# Patient Record
Sex: Male | Born: 1960 | Race: White | Hispanic: No | Smoking: Current every day smoker
Health system: Southern US, Community
[De-identification: ages and names within clinical notes are randomized; demographics above are authoritative.]

## PROBLEM LIST (undated history)

## (undated) DIAGNOSIS — I779 Disorder of arteries and arterioles, unspecified: Secondary | ICD-10-CM

## (undated) DIAGNOSIS — Z86718 Personal history of other venous thrombosis and embolism: Secondary | ICD-10-CM

## (undated) DIAGNOSIS — I739 Peripheral vascular disease, unspecified: Secondary | ICD-10-CM

## (undated) DIAGNOSIS — I639 Cerebral infarction, unspecified: Secondary | ICD-10-CM

## (undated) DIAGNOSIS — F17200 Nicotine dependence, unspecified, uncomplicated: Secondary | ICD-10-CM

## (undated) DIAGNOSIS — R3129 Other microscopic hematuria: Secondary | ICD-10-CM

## (undated) DIAGNOSIS — I10 Essential (primary) hypertension: Secondary | ICD-10-CM

## (undated) DIAGNOSIS — J449 Chronic obstructive pulmonary disease, unspecified: Secondary | ICD-10-CM

## (undated) DIAGNOSIS — I712 Thoracic aortic aneurysm, without rupture, unspecified: Secondary | ICD-10-CM

## (undated) DIAGNOSIS — R911 Solitary pulmonary nodule: Secondary | ICD-10-CM

## (undated) DIAGNOSIS — I4892 Unspecified atrial flutter: Secondary | ICD-10-CM

## (undated) DIAGNOSIS — E785 Hyperlipidemia, unspecified: Secondary | ICD-10-CM

## (undated) DIAGNOSIS — I4891 Unspecified atrial fibrillation: Secondary | ICD-10-CM

## (undated) HISTORY — DX: Cerebral infarction, unspecified: I63.9

## (undated) HISTORY — DX: Solitary pulmonary nodule: R91.1

## (undated) HISTORY — DX: Peripheral vascular disease, unspecified: I73.9

## (undated) HISTORY — DX: Thoracic aortic aneurysm, without rupture: I71.2

## (undated) HISTORY — DX: Hyperlipidemia, unspecified: E78.5

## (undated) HISTORY — DX: Unspecified atrial flutter: I48.92

## (undated) HISTORY — DX: Nicotine dependence, unspecified, uncomplicated: F17.200

## (undated) HISTORY — DX: Disorder of arteries and arterioles, unspecified: I77.9

## (undated) HISTORY — DX: Other microscopic hematuria: R31.29

## (undated) HISTORY — DX: Thoracic aortic aneurysm, without rupture, unspecified: I71.20

## (undated) HISTORY — DX: Chronic obstructive pulmonary disease, unspecified: J44.9

## (undated) HISTORY — DX: Personal history of other venous thrombosis and embolism: Z86.718

## (undated) HISTORY — PX: ATRIAL FIBRILLATION ABLATION: EP1191

---

## 2008-09-18 HISTORY — PX: MAZE: SHX5063

## 2017-01-01 DIAGNOSIS — I480 Paroxysmal atrial fibrillation: Secondary | ICD-10-CM | POA: Insufficient documentation

## 2017-06-18 ENCOUNTER — Emergency Department (HOSPITAL_COMMUNITY): Payer: Medicare Other

## 2017-06-18 ENCOUNTER — Encounter (HOSPITAL_COMMUNITY): Payer: Self-pay

## 2017-06-18 DIAGNOSIS — Z5321 Procedure and treatment not carried out due to patient leaving prior to being seen by health care provider: Secondary | ICD-10-CM | POA: Insufficient documentation

## 2017-06-18 DIAGNOSIS — M79602 Pain in left arm: Secondary | ICD-10-CM | POA: Diagnosis not present

## 2017-06-18 LAB — CBC
HCT: 50 % (ref 39.0–52.0)
Hemoglobin: 17.7 g/dL — ABNORMAL HIGH (ref 13.0–17.0)
MCH: 31.9 pg (ref 26.0–34.0)
MCHC: 35.4 g/dL (ref 30.0–36.0)
MCV: 90.3 fL (ref 78.0–100.0)
PLATELETS: 188 10*3/uL (ref 150–400)
RBC: 5.54 MIL/uL (ref 4.22–5.81)
RDW: 14.8 % (ref 11.5–15.5)
WBC: 9.8 10*3/uL (ref 4.0–10.5)

## 2017-06-18 LAB — BASIC METABOLIC PANEL
Anion gap: 8 (ref 5–15)
BUN: 18 mg/dL (ref 6–20)
CO2: 24 mmol/L (ref 22–32)
CREATININE: 1.35 mg/dL — AB (ref 0.61–1.24)
Calcium: 8.9 mg/dL (ref 8.9–10.3)
Chloride: 103 mmol/L (ref 101–111)
GFR calc Af Amer: >60 mL/min (ref >60)
GFR, EST NON AFRICAN AMERICAN: 57 mL/min — AB (ref >60)
GLUCOSE: 111 mg/dL — AB (ref 65–99)
Potassium: 3.7 mmol/L (ref 3.5–5.1)
Sodium: 135 mmol/L (ref 135–145)

## 2017-06-18 LAB — PROTIME-INR
INR: 2.87
Prothrombin Time: 29.9 seconds — ABNORMAL HIGH (ref 11.4–15.2)

## 2017-06-18 NOTE — ED Triage Notes (Signed)
Pt c/o of left arm pain at 5/10; pt denies injury; pt has bruising to left arm which has increased in the last few hours; pt is on coumadin for chronic afib; Pt a&ox 4 on arrival. No other complaints at triage

## 2017-06-19 ENCOUNTER — Emergency Department (HOSPITAL_COMMUNITY)
Admission: EM | Admit: 2017-06-19 | Discharge: 2017-06-19 | Discharge status: Against Medical Advice | Payer: Medicare Other | Attending: Emergency Medicine | Admitting: Emergency Medicine

## 2017-06-19 HISTORY — DX: Unspecified atrial fibrillation: I48.91

## 2017-06-19 HISTORY — DX: Essential (primary) hypertension: I10

## 2017-06-19 NOTE — ED Notes (Signed)
Pt seen walking out with family.

## 2017-11-21 ENCOUNTER — Ambulatory Visit (INDEPENDENT_AMBULATORY_CARE_PROVIDER_SITE_OTHER): Payer: Medicare Other | Admitting: Physician Assistant

## 2017-11-21 ENCOUNTER — Encounter: Payer: Self-pay | Admitting: Physician Assistant

## 2017-11-21 VITALS — BP 162/118 | HR 102 | Ht 73.0 in | Wt 191.0 lb

## 2017-11-21 DIAGNOSIS — I714 Abdominal aortic aneurysm, without rupture, unspecified: Secondary | ICD-10-CM

## 2017-11-21 DIAGNOSIS — K219 Gastro-esophageal reflux disease without esophagitis: Secondary | ICD-10-CM

## 2017-11-21 DIAGNOSIS — Z7689 Persons encountering health services in other specified circumstances: Secondary | ICD-10-CM

## 2017-11-21 DIAGNOSIS — Z7901 Long term (current) use of anticoagulants: Secondary | ICD-10-CM | POA: Diagnosis not present

## 2017-11-21 DIAGNOSIS — I358 Other nonrheumatic aortic valve disorders: Secondary | ICD-10-CM

## 2017-11-21 DIAGNOSIS — K4020 Bilateral inguinal hernia, without obstruction or gangrene, not specified as recurrent: Secondary | ICD-10-CM

## 2017-11-21 DIAGNOSIS — Z1389 Encounter for screening for other disorder: Secondary | ICD-10-CM

## 2017-11-21 DIAGNOSIS — I272 Pulmonary hypertension, unspecified: Secondary | ICD-10-CM

## 2017-11-21 DIAGNOSIS — E785 Hyperlipidemia, unspecified: Secondary | ICD-10-CM

## 2017-11-21 DIAGNOSIS — I340 Nonrheumatic mitral (valve) insufficiency: Secondary | ICD-10-CM

## 2017-11-21 DIAGNOSIS — J449 Chronic obstructive pulmonary disease, unspecified: Secondary | ICD-10-CM | POA: Diagnosis not present

## 2017-11-21 DIAGNOSIS — Z86718 Personal history of other venous thrombosis and embolism: Secondary | ICD-10-CM | POA: Insufficient documentation

## 2017-11-21 DIAGNOSIS — F17200 Nicotine dependence, unspecified, uncomplicated: Secondary | ICD-10-CM | POA: Insufficient documentation

## 2017-11-21 DIAGNOSIS — Z130 Encounter for screening for diseases of the blood and blood-forming organs and certain disorders involving the immune mechanism: Secondary | ICD-10-CM | POA: Diagnosis not present

## 2017-11-21 DIAGNOSIS — I10 Essential (primary) hypertension: Secondary | ICD-10-CM | POA: Diagnosis not present

## 2017-11-21 DIAGNOSIS — Z9289 Personal history of other medical treatment: Secondary | ICD-10-CM

## 2017-11-21 DIAGNOSIS — I480 Paroxysmal atrial fibrillation: Secondary | ICD-10-CM

## 2017-11-21 MED ORDER — LISINOPRIL 20 MG PO TABS: 20.0000 mg | ORAL_TABLET | Freq: Every day | ORAL | 0 refills | Status: DC

## 2017-11-21 MED ORDER — DILTIAZEM HCL ER COATED BEADS 180 MG PO CP24: 180.0000 mg | ORAL_CAPSULE | Freq: Every day | ORAL | 0 refills | Status: DC

## 2017-11-21 MED ORDER — BUDESONIDE-FORMOTEROL FUMARATE 160-4.5 MCG/ACT IN AERO: 2.0000 | INHALATION_SPRAY | Freq: Two times a day (BID) | RESPIRATORY_TRACT | 1 refills | Status: AC

## 2017-11-21 MED ORDER — ESOMEPRAZOLE MAGNESIUM 20 MG PO CPDR: 20.0000 mg | DELAYED_RELEASE_CAPSULE | Freq: Every day | ORAL | 0 refills | Status: DC

## 2017-11-21 MED ORDER — ALBUTEROL SULFATE HFA 108 (90 BASE) MCG/ACT IN AERS: 1.0000 | INHALATION_SPRAY | RESPIRATORY_TRACT | 1 refills | Status: AC | PRN

## 2017-11-21 MED ORDER — METOPROLOL SUCCINATE ER 100 MG PO TB24: 100.0000 mg | ORAL_TABLET | Freq: Every day | ORAL | 0 refills | Status: DC

## 2017-11-21 MED ORDER — PRAVASTATIN SODIUM 40 MG PO TABS: 40.0000 mg | ORAL_TABLET | Freq: Every day | ORAL | 3 refills | Status: DC

## 2017-11-21 MED ORDER — WARFARIN SODIUM 3 MG PO TABS: 3.0000 mg | ORAL_TABLET | Freq: Every day | ORAL | 2 refills | Status: DC

## 2017-11-21 NOTE — Progress Notes (Signed)
HPI:                                                                Jeremiah Delacruz is a 57 y.o. male who presents to [facility]: Primary Care Sports Medicine today to establish care  Current concerns include: scrotal pain, INR check  Testicle Pain  The patient's primary symptoms include scrotal swelling and testicular pain. This is a chronic problem. The current episode started more than 1 month ago. The problem occurs daily. The problem has been gradually worsening. The pain is medium. Pertinent negatives include no abdominal pain, anorexia, fever, frequency, hematuria, hesitancy, urgency or urinary retention. The testicular pain affects both testicles. There is swelling in both testicles. The symptoms are aggravated by heavy lifting. Treatments tried: self-reduction. The treatment provided mild relief.  B/l inguinal hernias, worse on the left. He has been able to self-reduce them, but recently has developed pain when reducing them.  Afib/HTN: reports history of multiple cardioversions and most recently maze procedure in 2010. He has been on Coumadin 3 mg daily. He also takes Diltiazem and Toprol XL. Typically takes his medication at night. Does not check BP's at home. Denies vision change, headache, chest pain with exertion, orthopnea, lightheadedness, syncope and edema. Risk factors include: tobacco use, HLD, male sex, age>55   Depression screen [facility] 2/9 11/21/2017  Decreased Interest 0  Down, Depressed, Hopeless 0  PHQ - 2 Score 0    No flowsheet data found.    Past Medical History:  Diagnosis Date  . A-fib (HCC)   . COPD (chronic obstructive pulmonary disease) (HCC)   . History of blood clots   . Hyperlipidemia   . Hypertension   . Microscopic hematuria 11/22/2017  . Tobacco use disorder    Past Surgical History:  Procedure Laterality Date  . ATRIAL FIBRILLATION ABLATION    . MAZE  2010   Social History   Tobacco Use  . Smoking status: Current Every Day  Smoker    Packs/day: 1.00    Years: 20.00    Pack years: 20.00    Types: Cigarettes  . Smokeless tobacco: Never Used  Substance Use Topics  . Alcohol use: Yes   family history is not on file.    ROS: negative except as noted in the HPI  Medications: Current Outpatient Medications  Medication Sig Dispense Refill  . albuterol (PROVENTIL HFA;VENTOLIN HFA) 108 (90 Base) MCG/ACT inhaler Inhale 1-2 puffs into the lungs every 4 (four) hours as needed for wheezing or shortness of breath. 2 Inhaler 1  . aspirin EC 81 MG tablet Take by mouth.    . budesonide-formoterol (SYMBICORT) 160-4.5 MCG/ACT inhaler Inhale 2 puffs into the lungs 2 (two) times daily. 1 Inhaler 1  . diltiazem (CARDIZEM CD) 180 MG 24 hr capsule Take 1 capsule (180 mg total) by mouth daily. 90 capsule 0  . esomeprazole (NEXIUM) 20 MG capsule Take 1 capsule (20 mg total) by mouth daily. 90 capsule 0  . warfarin (COUMADIN) 3 MG tablet Take 1 tablet (3 mg total) by mouth daily. 30 tablet 2  . lisinopril (PRINIVIL,ZESTRIL) 20 MG tablet Take 1 tablet (20 mg total) by mouth daily. 90 tablet 0  . metoprolol succinate (TOPROL-XL) 100 MG 24 hr tablet Take 1  tablet (100 mg total) by mouth daily. Take with or immediately following a meal. 90 tablet 0  . pravastatin (PRAVACHOL) 40 MG tablet Take 1 tablet (40 mg total) by mouth at bedtime. 90 tablet 3   No current facility-administered medications for this visit.    No Known Allergies     Objective:  BP (!) 162/118   Pulse (!) 102   Ht 6\' 1"  (1.854 m)   Wt 191 lb (86.6 kg)   BMI 25.20 kg/m  Gen:  alert, not ill-appearing, no distress, appears older than stated age HEENT: head normocephalic without obvious abnormality, conjunctiva and cornea clear, trachea midline Pulm: Normal work of breathing, normal phonation, clear to auscultation bilaterally, no wheezes, rales or rhonchi CV: mildly tachycardic, regular rhythm, s1 and s2 distinct, no murmurs, clicks or rubs  Neuro: alert  and oriented x 3, no tremor GI: abdomen with well-healed laparotomy incision, no pulsatile masses, no abdominal bruit, abdomen non-tender, non-distended, no guarding or rigidity GU: circumcised male, testicles descended bilaterally, palpable bulge in the inguinal ring with valsalva bilaterally MSK: extremities atraumatic, normal gait and station Skin: intact, no rashes on exposed skin, no jaundice, no cyanosis Psych: well-groomed, cooperative, good eye contact, euthymic mood, affect mood-congruent, speech is articulate, and thought processes clear and goal-directed  A chaperone was used for the GU exam, Rodney Langton, MD.  No results found for this or any previous visit (from the past 72 hour(s)). No results found.    Assessment and Plan: 57 y.o. male with   1. Encounter to establish care - reviewed PMH, PSH, PFH, medications and allergies - reviewed health maintenance - colonoscopy UTD per [place], [place] to obtain records   2. Paroxysmal atrial fibrillation (HCC) - CHADSVASC score 3, moderate-high risk - patient does not have his medical records with him today. Reports complicated history of OHS for Maze procedure and an abdominal surgery for a blood clot. Last echo was 12/2016 - he is mildly tachycardic today, but I think that is because he is taking regular release Metoprolol once in the evenings only. I am switching him to Toprol XL. He can continue this QHS, but I instructed him to take his Cardizem and Lisinopril during the day - diltiazem (CARDIZEM CD) 180 MG 24 hr capsule; Take 1 capsule (180 mg total) by mouth daily.  Dispense: 90 capsule; Refill: 0 - metoprolol succinate (TOPROL-XL) 100 MG 24 hr tablet; Take 1 tablet (100 mg total) by mouth daily. Take with or immediately following a meal.  Dispense: 90 tablet; Refill: 0 - Ambulatory referral to Cardiology - warfarin (COUMADIN) 3 MG tablet; Take 1 tablet (3 mg total) by mouth daily.  Dispense: 30 tablet;  Refill: 2 - ECHOCARDIOGRAM COMPLETE; Future  3. Long term current use of anticoagulants with INR goal of 2.0-3.0 - POC INR at goal. Continue current dose. Recheck in 8 weeks - warfarin (COUMADIN) 3 MG tablet; Take 1 tablet (3 mg total) by mouth daily.  Dispense: 30 tablet; Refill: 2  4. History of blood clots - warfarin (COUMADIN) 3 MG tablet; Take 1 tablet (3 mg total) by mouth daily.  Dispense: 30 tablet; Refill: 2  5. Nicotine dependence with current use - declines smoking cessation  6. Chronic obstructive pulmonary disease, unspecified COPD type (HCC) - followed by Pulmonology - albuterol (PROVENTIL HFA;VENTOLIN HFA) 108 (90 Base) MCG/ACT inhaler; Inhale 1-2 puffs into the lungs every 4 (four) hours as needed for wheezing or shortness of breath.  Dispense: 2 Inhaler;  Refill: 1 - budesonide-formoterol (SYMBICORT) 160-4.5 MCG/ACT inhaler; Inhale 2 puffs into the lungs 2 (two) times daily.  Dispense: 1 Inhaler; Refill: 1  7. Uncontrolled stage 2 hypertension BP Readings from Last 3 Encounters:  11/21/17 (!) 162/118  06/18/17 (!) 168/113  - adding ACE to CCB and changing to Toprol XL - diltiazem (CARDIZEM CD) 180 MG 24 hr capsule; Take 1 capsule (180 mg total) by mouth daily.  Dispense: 90 capsule; Refill: 0 - metoprolol succinate (TOPROL-XL) 100 MG 24 hr tablet; Take 1 tablet (100 mg total) by mouth daily. Take with or immediately following a meal.  Dispense: 90 tablet; Refill: 0 - lisinopril (PRINIVIL,ZESTRIL) 20 MG tablet; Take 1 tablet (20 mg total) by mouth daily.  Dispense: 90 tablet; Refill: 0 - COMPLETE METABOLIC PANEL WITH GFR - Urinalysis, Routine w reflex microscopic - ECHOCARDIOGRAM COMPLETE; Future  8. Dyslipidemia, goal LDL below 70 - he has been intolerant to statins (myalgias) in the past and is not currently on a statin. We discussed the risks and benefits. He is open to trying Pravastatin. Instructed to start every other evening and if tolerating, increase to QHS -  pravastatin (PRAVACHOL) 40 MG tablet; Take 1 tablet (40 mg total) by mouth at bedtime.  Dispense: 90 tablet; Refill: 3 - Lipid Panel w/reflex Direct LDL  9. Gastroesophageal reflux disease, esophagitis presence not specified - esomeprazole (NEXIUM) 20 MG capsule; Take 1 capsule (20 mg total) by mouth daily.  Dispense: 90 capsule; Refill: 0  10. Non-recurrent bilateral inguinal hernia without obstruction or gangrene - Ambulatory referral to General Surgery  11. Abdominal aortic aneurysm (AAA) without rupture (HCC) - US ABDOMINAL AORTA SCREENING AAA; Future  12. Screening for blood disease - CBC - COMPLETE METABOLIC PANEL WITH GFR  13. Screening for blood or protein in urine - Urinalysis, Routine w reflex microscopic  Patient education and anticipatory guidance given Patient agrees with treatment plan Follow-up in 8 weeks or sooner as needed if symptoms worsen or fail to improve  Levonne Hubertharley E. Keyana Guevara PA-C

## 2017-11-21 NOTE — Patient Instructions (Addendum)
For your blood pressure: - Goal <130/80 - Take your Cardizem and Lisinopril in the morning. Okay to take your Metoprolol at night - monitor and log blood pressures at home - check around the same time each day in a relaxed setting - Limit salt to <2000 mg/day - Follow DASH eating plan - limit alcohol to 2 standard drinks per day for men and 1 per day for women - avoid tobacco products - weight loss: 7% of current body weight - follow-up every 3 months for your blood pressure

## 2017-11-22 ENCOUNTER — Encounter: Payer: Self-pay | Admitting: Physician Assistant

## 2017-11-22 DIAGNOSIS — R3129 Other microscopic hematuria: Secondary | ICD-10-CM

## 2017-11-22 DIAGNOSIS — E785 Hyperlipidemia, unspecified: Secondary | ICD-10-CM | POA: Insufficient documentation

## 2017-11-22 HISTORY — DX: Other microscopic hematuria: R31.29

## 2017-11-22 LAB — COMPLETE METABOLIC PANEL WITH GFR
AG RATIO: 1.4 (calc) (ref 1.0–2.5)
ALT: 10 U/L (ref 9–46)
AST: 14 U/L (ref 10–35)
Albumin: 3.8 g/dL (ref 3.6–5.1)
Alkaline phosphatase (APISO): 67 U/L (ref 40–115)
BILIRUBIN TOTAL: 0.9 mg/dL (ref 0.2–1.2)
BUN: 13 mg/dL (ref 7–25)
CHLORIDE: 102 mmol/L (ref 98–110)
CO2: 32 mmol/L (ref 20–32)
Calcium: 9 mg/dL (ref 8.6–10.3)
Creat: 1.2 mg/dL (ref 0.70–1.33)
GFR, EST AFRICAN AMERICAN: 77 mL/min/{1.73_m2} (ref >=60)
GFR, Est Non African American: 66 mL/min/{1.73_m2} (ref >=60)
Globulin: 2.7 g/dL (calc) (ref 1.9–3.7)
Glucose, Bld: 88 mg/dL (ref 65–99)
POTASSIUM: 3.8 mmol/L (ref 3.5–5.3)
Sodium: 139 mmol/L (ref 135–146)
Total Protein: 6.5 g/dL (ref 6.1–8.1)

## 2017-11-22 LAB — URINALYSIS, ROUTINE W REFLEX MICROSCOPIC
BILIRUBIN URINE: NEGATIVE
Bacteria, UA: NONE SEEN /HPF
GLUCOSE, UA: NEGATIVE
HYALINE CAST: NONE SEEN /LPF
KETONES UR: NEGATIVE
LEUKOCYTES UA: NEGATIVE
Nitrite: NEGATIVE
Specific Gravity, Urine: 1.014 (ref 1.001–1.03)
Squamous Epithelial / LPF: NONE SEEN /HPF (ref <=5)
pH: 7 (ref 5.0–8.0)

## 2017-11-22 LAB — LIPID PANEL W/REFLEX DIRECT LDL
Cholesterol: 196 mg/dL (ref <200)
HDL: 36 mg/dL — AB (ref >40)
LDL Cholesterol (Calc): 122 mg/dL (calc) — ABNORMAL HIGH
Non-HDL Cholesterol (Calc): 160 mg/dL (calc) — ABNORMAL HIGH (ref <130)
Total CHOL/HDL Ratio: 5.4 (calc) — ABNORMAL HIGH (ref <5.0)
Triglycerides: 237 mg/dL — ABNORMAL HIGH (ref <150)

## 2017-11-22 LAB — CBC
HCT: 54.4 % — ABNORMAL HIGH (ref 38.5–50.0)
Hemoglobin: 18.9 g/dL — ABNORMAL HIGH (ref 13.2–17.1)
MCH: 31.4 pg (ref 27.0–33.0)
MCHC: 34.7 g/dL (ref 32.0–36.0)
MCV: 90.4 fL (ref 80.0–100.0)
MPV: 11.3 fL (ref 7.5–12.5)
PLATELETS: 187 10*3/uL (ref 140–400)
RBC: 6.02 10*6/uL — ABNORMAL HIGH (ref 4.20–5.80)
RDW: 13.9 % (ref 11.0–15.0)
WBC: 7.8 10*3/uL (ref 3.8–10.8)

## 2017-11-22 NOTE — Progress Notes (Signed)
Hemoglobin level is high, this is likely due to smoking LDL cholesterol 122, goal is less than 70, so I would like him to start the Pravastatin every other night and then nightly like we talked about He has microscopic blood in his urine. I want to recheck this at his next follow-up.

## 2017-11-27 ENCOUNTER — Encounter: Payer: Self-pay | Admitting: Physician Assistant

## 2017-11-27 DIAGNOSIS — K219 Gastro-esophageal reflux disease without esophagitis: Secondary | ICD-10-CM | POA: Insufficient documentation

## 2017-11-27 DIAGNOSIS — Z9289 Personal history of other medical treatment: Secondary | ICD-10-CM | POA: Insufficient documentation

## 2017-11-27 DIAGNOSIS — I358 Other nonrheumatic aortic valve disorders: Secondary | ICD-10-CM | POA: Insufficient documentation

## 2017-11-27 DIAGNOSIS — I340 Nonrheumatic mitral (valve) insufficiency: Secondary | ICD-10-CM | POA: Insufficient documentation

## 2017-11-27 DIAGNOSIS — I272 Pulmonary hypertension, unspecified: Secondary | ICD-10-CM | POA: Insufficient documentation

## 2017-11-27 DIAGNOSIS — J449 Chronic obstructive pulmonary disease, unspecified: Secondary | ICD-10-CM | POA: Insufficient documentation

## 2017-11-27 DIAGNOSIS — I714 Abdominal aortic aneurysm, without rupture, unspecified: Secondary | ICD-10-CM | POA: Insufficient documentation

## 2017-11-27 DIAGNOSIS — K449 Diaphragmatic hernia without obstruction or gangrene: Secondary | ICD-10-CM

## 2017-11-27 DIAGNOSIS — K4020 Bilateral inguinal hernia, without obstruction or gangrene, not specified as recurrent: Secondary | ICD-10-CM | POA: Insufficient documentation

## 2017-11-28 ENCOUNTER — Telehealth: Payer: Self-pay

## 2017-11-28 NOTE — Telephone Encounter (Signed)
-----   Message from [facility] LLCChar[facility] Elizabeth Cummings, [place] sent at 11/27/2017  9:19 PM EDT ----- Regarding: Echo Can you let patient know that I found an echocardiogram report from April 2018. I would recommend that he see Cardiology first, and reschedule echo based on their recommendations. I would hate for him to pay for a test that he does not need.

## 2017-11-28 NOTE — Telephone Encounter (Signed)
Pt notified and was given the number to Dr. Jens Somrenshaw office to schedule appointment. -EH/RMA

## 2017-11-29 ENCOUNTER — Other Ambulatory Visit: Payer: Self-pay

## 2017-11-29 ENCOUNTER — Telehealth: Payer: Self-pay | Admitting: Physician Assistant

## 2017-11-29 MED ORDER — OMEPRAZOLE 20 MG PO CPDR: 20.0000 mg | DELAYED_RELEASE_CAPSULE | Freq: Every day | ORAL | 0 refills | Status: DC

## 2017-11-29 NOTE — Telephone Encounter (Signed)
Omeprazole 20 mg sent to pharmacy -EH/RMA

## 2017-11-29 NOTE — Telephone Encounter (Signed)
Patient is requesting a medication substitute due to cost. He would prefer to be prescribed Prilosec instead of Nexium. Please advise. Thanks.

## 2017-12-19 ENCOUNTER — Ambulatory Visit (HOSPITAL_BASED_OUTPATIENT_CLINIC_OR_DEPARTMENT_OTHER): Payer: Medicare Other

## 2018-01-10 ENCOUNTER — Ambulatory Visit (HOSPITAL_BASED_OUTPATIENT_CLINIC_OR_DEPARTMENT_OTHER): Payer: Medicare Other

## 2018-01-11 NOTE — Progress Notes (Deleted)
Referring-Charley Doran HeaterElizabeth Cummings PA-C Reason for referral-atrial fibrillation  HPI: 57 year old male for evaluation of atrial fibrillation at request of Carlis StableCharley Elizabeth Cummings, PA-C.  Previously followed at [facility]. 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  Most recent echocardiogram in 2018 showed normal LV function, mild to moderate left atrial enlargement, mild right atrial enlargement, moderate mitral regurgitation and mild to moderate pulmonary hypertension.  Had atrial flutter April 2018.  Also with prior aortic aneurysm repair.  Current Outpatient Medications  Medication Sig Dispense Refill  . albuterol (PROVENTIL HFA;VENTOLIN HFA) 108 (90 Base) MCG/ACT inhaler Inhale 1-2 puffs into the lungs every 4 (four) hours as needed for wheezing or shortness of breath. 2 Inhaler 1  . aspirin EC 81 MG tablet Take by mouth.    . budesonide-formoterol (SYMBICORT) 160-4.5 MCG/ACT inhaler Inhale 2 puffs into the lungs 2 (two) times daily. 1 Inhaler 1  . diltiazem (CARDIZEM CD) 180 MG 24 hr capsule Take 1 capsule (180 mg total) by mouth daily. 90 capsule 0  . lisinopril (PRINIVIL,ZESTRIL) 20 MG tablet Take 1 tablet (20 mg total) by mouth daily. 90 tablet 0  . metoprolol succinate (TOPROL-XL) 100 MG 24 hr tablet Take 1 tablet (100 mg total) by mouth daily. Take with or immediately following a meal. 90 tablet 0  . omeprazole (PRILOSEC) 20 MG capsule Take 1 capsule (20 mg total) by mouth daily. 90 capsule 0  . pravastatin (PRAVACHOL) 40 MG tablet Take 1 tablet (40 mg total) by mouth at bedtime. 90 tablet 3  . warfarin (COUMADIN) 3 MG tablet Take 1 tablet (3 mg total) by mouth daily. 30 tablet 2   No current facility-administered medications for this visit.     No Known Allergies  Past Medical History:  Diagnosis Date  . A-fib (HCC)   . COPD (chronic obstructive pulmonary disease) (HCC)   . History of blood clots   . Hyperlipidemia   . Hypertension   . Microscopic  hematuria 11/22/2017  . Tobacco use disorder     Past Surgical History:  Procedure Laterality Date  . ATRIAL FIBRILLATION ABLATION    . MAZE  2010    Social History   Socioeconomic History  . Marital status: Divorced    Spouse name: Not on file  . Number of children: Not on file  . Years of education: Not on file  . Highest education level: Not on file  Occupational History  . Not on file  Social Needs  . Financial resource strain: Not on file  . Food insecurity:    Worry: Not on file    Inability: Not on file  . Transportation needs:    Medical: Not on file    Non-medical: Not on file  Tobacco Use  . Smoking status: Current Every Day Smoker    Packs/day: 1.00    Years: 20.00    Pack years: 20.00    Types: Cigarettes  . Smokeless tobacco: Never Used  Substance and Sexual Activity  . Alcohol use: Yes  . Drug use: No  . Sexual activity: Not Currently  Lifestyle  . Physical activity:    Days per week: Not on file    Minutes per session: Not on file  . Stress: Not on file  Relationships  . Social connections:    Talks on phone: Not on file    Gets together: Not on file    Attends religious service: Not on file    Active member of club or  organization: Not on file    Attends meetings of clubs or organizations: Not on file    Relationship status: Not on file  . Intimate partner violence:    Fear of current or ex partner: Not on file    Emotionally abused: Not on file    Physically abused: Not on file    Forced sexual activity: Not on file  Other Topics Concern  . Not on file  Social History Narrative  . Not on file    No family history on file.  ROS: no fevers or chills, productive cough, hemoptysis, dysphasia, odynophagia, melena, hematochezia, dysuria, hematuria, rash, seizure activity, orthopnea, PND, pedal edema, claudication. Remaining systems are negative.  Physical Exam:   There were no vitals taken for this visit.  General:  Well developed/well  nourished in NAD Skin warm/dry Patient not depressed No peripheral clubbing Back-normal HEENT-normal/normal eyelids Neck supple/normal carotid upstroke bilaterally; no bruits; no JVD; no thyromegaly chest - CTA/ normal expansion CV - RRR/normal S1 and S2; no murmurs, rubs or gallops;  PMI nondisplaced Abdomen -NT/ND, no HSM, no mass, + bowel sounds, no bruit 2+ femoral pulses, no bruits Ext-no edema, chords, 2+ DP Neuro-grossly nonfocal  ECG - personally reviewed  A/P  1  Olga Millers, MD

## 2018-01-16 ENCOUNTER — Ambulatory Visit: Payer: Medicare Other | Admitting: Cardiology

## 2018-01-16 ENCOUNTER — Encounter: Payer: Self-pay | Admitting: Physician Assistant

## 2018-01-16 ENCOUNTER — Ambulatory Visit (INDEPENDENT_AMBULATORY_CARE_PROVIDER_SITE_OTHER): Payer: Medicare Other | Admitting: Physician Assistant

## 2018-01-16 VITALS — BP 145/98 | HR 106 | Resp 16 | Wt 185.0 lb

## 2018-01-16 DIAGNOSIS — R3129 Other microscopic hematuria: Secondary | ICD-10-CM

## 2018-01-16 DIAGNOSIS — R9431 Abnormal electrocardiogram [ECG] [EKG]: Secondary | ICD-10-CM | POA: Diagnosis not present

## 2018-01-16 DIAGNOSIS — K219 Gastro-esophageal reflux disease without esophagitis: Secondary | ICD-10-CM | POA: Diagnosis not present

## 2018-01-16 DIAGNOSIS — Z7901 Long term (current) use of anticoagulants: Secondary | ICD-10-CM

## 2018-01-16 DIAGNOSIS — E785 Hyperlipidemia, unspecified: Secondary | ICD-10-CM | POA: Diagnosis not present

## 2018-01-16 DIAGNOSIS — I10 Essential (primary) hypertension: Secondary | ICD-10-CM

## 2018-01-16 DIAGNOSIS — I480 Paroxysmal atrial fibrillation: Secondary | ICD-10-CM

## 2018-01-16 LAB — POCT INR: INR: 2.7

## 2018-01-16 MED ORDER — PRAVASTATIN SODIUM 40 MG PO TABS: 40.0000 mg | ORAL_TABLET | Freq: Every day | ORAL | 3 refills | Status: DC

## 2018-01-16 MED ORDER — METOPROLOL SUCCINATE ER 100 MG PO TB24: 100.0000 mg | ORAL_TABLET | Freq: Every day | ORAL | 0 refills | Status: DC

## 2018-01-16 MED ORDER — DILTIAZEM HCL ER 240 MG PO CP24: 240.0000 mg | ORAL_CAPSULE | Freq: Every day | ORAL | 0 refills | Status: DC

## 2018-01-16 MED ORDER — OMEPRAZOLE 20 MG PO CPDR: 20.0000 mg | DELAYED_RELEASE_CAPSULE | Freq: Every day | ORAL | 0 refills | Status: DC

## 2018-01-16 MED ORDER — LISINOPRIL 20 MG PO TABS: 20.0000 mg | ORAL_TABLET | Freq: Every day | ORAL | 0 refills | Status: DC

## 2018-01-16 MED ORDER — WARFARIN SODIUM 3 MG PO TABS: 3.0000 mg | ORAL_TABLET | Freq: Every day | ORAL | 2 refills | Status: DC

## 2018-01-16 NOTE — Patient Instructions (Addendum)
Specialist Appointments: [facility] (for hernia)  P: [phone] Dr. Jens Som, [facility] at [facility] P: [phone]   For blood pressure/Afib - increasing your Diltiazem to 240 mg daily (new prescription sent) - continue your Lisinopril and Metoprolol XL daily - Goal <130/80 - monitor and log blood pressures at home - check around the same time each day in a relaxed setting - Limit salt to <2000 mg/day - Follow DASH eating plan - limit alcohol to 2 standard drinks per day for men and 1 per day for women - avoid tobacco products - weight loss: 7% of current body weight - follow-up every 6 months for your blood pressure

## 2018-01-16 NOTE — Progress Notes (Signed)
HPI:                                                                Jeremiah Delacruz is a 57 y.o. male who presents to [facility]: [facility] Medicine today for medication management  Afib: taking Coumadin 3 mg daily. Due for INR check today.  HTN: taking Lisinopril 20 mg, Metoprolol XL 100 mg, Diltiazem ER 180 mg daily. Compliant with medications. Does not check BP's at home. Denies vision change, headache, chest pain with exertion, orthopnea, lightheadedness, syncope and edema. Risk factors include: tobacco use, HLD, age>55, male sex  HLD: history of statin intolerance. He is tolerating Pravastatin nightly  Depression screen PHQ 2/9 11/21/2017  Decreased Interest 0  Down, Depressed, Hopeless 0  PHQ - 2 Score 0     Past Medical History:  Diagnosis Date  . A-fib (HCC)   . COPD (chronic obstructive pulmonary disease) (HCC)   . History of blood clots   . Hyperlipidemia   . Hypertension   . Microscopic hematuria 11/22/2017  . Tobacco use disorder    Past Surgical History:  Procedure Laterality Date  . ATRIAL FIBRILLATION ABLATION    . MAZE  2010   Social History   Tobacco Use  . Smoking status: Current Every Day Smoker    Packs/day: 1.00    Years: 20.00    Pack years: 20.00    Types: Cigarettes  . Smokeless tobacco: Never Used  Substance Use Topics  . Alcohol use: Yes   family history is not on file.    ROS: negative except as noted in the HPI  Medications: Current Outpatient Medications  Medication Sig Dispense Refill  . albuterol (PROVENTIL HFA;VENTOLIN HFA) 108 (90 Base) MCG/ACT inhaler Inhale 1-2 puffs into the lungs every 4 (four) hours as needed for wheezing or shortness of breath. 2 Inhaler 1  . aspirin EC 81 MG tablet Take by mouth.    . budesonide-formoterol (SYMBICORT) 160-4.5 MCG/ACT inhaler Inhale 2 puffs into the lungs 2 (two) times daily. 1 Inhaler 1  . diltiazem (CARDIZEM CD) 180 MG 24 hr capsule Take 1 capsule (180 mg  total) by mouth daily. 90 capsule 0  . lisinopril (PRINIVIL,ZESTRIL) 20 MG tablet Take 1 tablet (20 mg total) by mouth daily. 90 tablet 0  . metoprolol succinate (TOPROL-XL) 100 MG 24 hr tablet Take 1 tablet (100 mg total) by mouth daily. Take with or immediately following a meal. 90 tablet 0  . omeprazole (PRILOSEC) 20 MG capsule Take 1 capsule (20 mg total) by mouth daily. 90 capsule 0  . pravastatin (PRAVACHOL) 40 MG tablet Take 1 tablet (40 mg total) by mouth at bedtime. 90 tablet 3  . warfarin (COUMADIN) 3 MG tablet Take 1 tablet (3 mg total) by mouth daily. 30 tablet 2  . diltiazem (DILACOR XR) 240 MG 24 hr capsule Take 1 capsule (240 mg total) by mouth daily. 90 capsule 0   No current facility-administered medications for this visit.    No Known Allergies     Objective:  BP (!) 145/98   Pulse (!) 106   Resp 16   Wt 185 lb (83.9 kg)   SpO2 97%   BMI 24.41 kg/m  Gen:  alert, not ill-appearing, no distress,  appropriate for age HEENT: head normocephalic without obvious abnormality, conjunctiva and cornea clear, trachea midline Pulm: Normal work of breathing, normal phonation, clear to auscultation bilaterally, no wheezes, rales or rhonchi CV: mildly tachycardic, regular rhythm, s1 and s2 distinct, no murmurs, clicks or rubs  Neuro: alert and oriented x 3, no tremor MSK: extremities atraumatic, normal gait and station Skin: intact, no rashes on exposed skin, no jaundice, no cyanosis Psych: well-groomed, cooperative, good eye contact, euthymic mood, affect mood-congruent, speech is articulate, and thought processes clear and goal-directed  Lab Results  Component Value Date   WBC 7.8 11/21/2017   HGB 18.9 (H) 11/21/2017   HCT 54.4 (H) 11/21/2017   MCV 90.4 11/21/2017   PLT 187 11/21/2017   Lab Results  Component Value Date   CREATININE 1.20 11/21/2017   BUN 13 11/21/2017   NA 139 11/21/2017   K 3.8 11/21/2017   CL 102 11/21/2017   CO2 32 11/21/2017   Lab Results   Component Value Date   ALT 10 11/21/2017   AST 14 11/21/2017   BILITOT 0.9 11/21/2017   Lab Results  Component Value Date   CHOL 196 11/21/2017   HDL 36 (L) 11/21/2017   LDLCALC 122 (H) 11/21/2017   TRIG 237 (H) 11/21/2017   CHOLHDL 5.4 (H) 11/21/2017      Results for orders placed or performed in visit on 01/16/18 (from the past 72 hour(s))  POCT INR     Status: None   Collection Time: 01/16/18  2:04 PM  Result Value Ref Range   INR 2.7    No results found.  ECG 01/16/2018 1:39 pm Vent Rate 105 bpm PR-I 208 ms QRS 94 ms QT/QTc 320/422 Sinus tachycardia Nonspecific ST and T wave abnormality No prior ECG  Assessment and Plan: 57 y.o. male with   Paroxysmal Afib - ECG showing sinus tachycardia today and meets criteria for LVH. He is not rate controlled. Increasing his Diltiazem to 240 mg. Continue Toprol XL 100 mg. Echo is scheduled for this month. Keep follow-up appt with Cardiology - recheck INR Q8weeks  Microscopic hematuria - rechecking urine micro today  Dyslipidemia 51yr ASCVD risk 22.3% - last LDL 122, 1 month ago. Started on Pravastatin and is tolerating 40 mg nightly . Multiple CVD risk factors, goal <70. Recheck fasting lipids in 8 weeks  Hypertension BP Readings from Last 3 Encounters:  01/16/18 (!) 145/98  11/21/17 (!) 162/118  06/18/17 (!) 168/113  - BP improved, still not at goal. Increasing Dilt for rate control    Long term current use of anticoagulants with INR goal of 2.0-3.0 - Plan: warfarin (COUMADIN) 3 MG tablet, POCT INR  Uncontrolled stage 2 hypertension - Plan: lisinopril (PRINIVIL,ZESTRIL) 20 MG tablet, metoprolol succinate (TOPROL-XL) 100 MG 24 hr tablet, diltiazem (DILACOR XR) 240 MG 24 hr capsule  Paroxysmal atrial fibrillation (HCC) - Plan: metoprolol succinate (TOPROL-XL) 100 MG 24 hr tablet, warfarin (COUMADIN) 3 MG tablet, POCT INR, diltiazem (DILACOR XR) 240 MG 24 hr capsule  Dyslipidemia, goal LDL below 70 - Plan:  pravastatin (PRAVACHOL) 40 MG tablet  Microscopic hematuria - Plan: Urinalysis, microscopic only  Abnormal resting ECG findings - sinus tach, LVH, nonspecific T wave abnormality  Gastroesophageal reflux disease without esophagitis - Plan: omeprazole (PRILOSEC) 20 MG capsule   Patient education and anticipatory guidance given Patient agrees with treatment plan Follow-up in 3 months for medication management or sooner as needed if symptoms worsen or fail to improve  Levonne Hubert PA-C

## 2018-01-17 NOTE — Addendum Note (Signed)
Addended by: Collie Siad on: 01/17/2018 04:08 PM   Modules accepted: Orders

## 2018-01-18 ENCOUNTER — Telehealth: Payer: Self-pay | Admitting: Physician Assistant

## 2018-01-18 NOTE — Telephone Encounter (Signed)
Pt advised and appt moved

## 2018-01-18 NOTE — Telephone Encounter (Signed)
-----   Message from [facility], [place] sent at 01/17/2018  4:40 PM EDT ----- I noticed that Jeremiah Delacruz rescheduled his cardiology appointment. I would really like for him to see the cardiologist before he next sees me. If you can have him reschedule his appointment with me for after August 14th, that would be great.

## 2018-01-30 ENCOUNTER — Ambulatory Visit (HOSPITAL_BASED_OUTPATIENT_CLINIC_OR_DEPARTMENT_OTHER): Payer: Medicare Other

## 2018-02-01 ENCOUNTER — Ambulatory Visit (HOSPITAL_BASED_OUTPATIENT_CLINIC_OR_DEPARTMENT_OTHER): Payer: Medicare Other

## 2018-03-18 ENCOUNTER — Ambulatory Visit (INDEPENDENT_AMBULATORY_CARE_PROVIDER_SITE_OTHER): Payer: Medicare Other | Admitting: Physician Assistant

## 2018-03-18 DIAGNOSIS — I480 Paroxysmal atrial fibrillation: Secondary | ICD-10-CM

## 2018-03-18 DIAGNOSIS — Z7901 Long term (current) use of anticoagulants: Secondary | ICD-10-CM | POA: Diagnosis not present

## 2018-03-18 LAB — POCT INR: INR: 3.2 — AB (ref 2.0–3.0)

## 2018-03-18 NOTE — Patient Instructions (Signed)
Advised patient per Georgette Doverharley PA-C that he needs to continue current Coumadin dose and we will recheck in 7 days. Patient agrees with plan of care. KG LPN

## 2018-03-25 ENCOUNTER — Ambulatory Visit (INDEPENDENT_AMBULATORY_CARE_PROVIDER_SITE_OTHER): Payer: Medicare Other | Admitting: Physician Assistant

## 2018-03-25 DIAGNOSIS — Z7901 Long term (current) use of anticoagulants: Secondary | ICD-10-CM | POA: Diagnosis not present

## 2018-03-25 DIAGNOSIS — I480 Paroxysmal atrial fibrillation: Secondary | ICD-10-CM

## 2018-03-25 LAB — POCT INR: INR: 3.1 — AB (ref 2.0–3.0)

## 2018-04-08 ENCOUNTER — Ambulatory Visit (INDEPENDENT_AMBULATORY_CARE_PROVIDER_SITE_OTHER): Payer: Medicare Other | Admitting: Physician Assistant

## 2018-04-08 DIAGNOSIS — I480 Paroxysmal atrial fibrillation: Secondary | ICD-10-CM

## 2018-04-08 DIAGNOSIS — Z7901 Long term (current) use of anticoagulants: Secondary | ICD-10-CM | POA: Diagnosis not present

## 2018-04-08 LAB — POCT INR: INR: 2.8 (ref 2.0–3.0)

## 2018-04-09 NOTE — Progress Notes (Signed)
Left VM with recommendation  

## 2018-04-18 ENCOUNTER — Ambulatory Visit: Payer: Medicare Other | Admitting: Physician Assistant

## 2018-04-20 ENCOUNTER — Encounter: Payer: Self-pay | Admitting: Emergency Medicine

## 2018-04-20 ENCOUNTER — Emergency Department (INDEPENDENT_AMBULATORY_CARE_PROVIDER_SITE_OTHER)
Admission: EM | Admit: 2018-04-20 | Discharge: 2018-04-20 | Disposition: A | Discharge status: Home / Self Care | Payer: Medicare Other | Source: Home / Self Care | Attending: Emergency Medicine | Admitting: Emergency Medicine

## 2018-04-20 ENCOUNTER — Other Ambulatory Visit: Payer: Self-pay

## 2018-04-20 DIAGNOSIS — I4892 Unspecified atrial flutter: Secondary | ICD-10-CM

## 2018-04-20 DIAGNOSIS — R001 Bradycardia, unspecified: Secondary | ICD-10-CM

## 2018-04-20 DIAGNOSIS — R42 Dizziness and giddiness: Secondary | ICD-10-CM

## 2018-04-20 NOTE — Discharge Instructions (Signed)
Transported via EMS for evaluation.

## 2018-04-20 NOTE — ED Triage Notes (Signed)
Pt c/o dizziness, nausea and arm and leg weakness x1 week. States sxs are intermittent but becoming more frequent. He does have hx of afib and is on anticoag.

## 2018-04-20 NOTE — ED Provider Notes (Signed)
[facility]    CSN: [id number] Arrival date & time: 04/20/18  1121     History   Chief Complaint Chief Complaint  Patient presents with  . Dizziness    HPI Stephan Nelis is a 57 y.o. male.  Patient has a history of atrial fib status post ablation.  He enters today with a one-month history of episodes of dizziness and feeling faint.  He had a syncopal episode 1 month ago and this morning staggered into a wall.  He does not describe any episodes of rapid heartbeat.  He does have a history of 1 pack/day cigarette smoking. HPI  Past Medical History:  Diagnosis Date  . A-fib (HCC)   . COPD (chronic obstructive pulmonary disease) (HCC)   . History of blood clots   . Hyperlipidemia   . Hypertension   . Microscopic hematuria 11/22/2017  . Tobacco use disorder     Patient Active Problem List   Diagnosis Date Noted  . Abnormal resting ECG findings 01/16/2018  . Chronic obstructive pulmonary disease (HCC) 11/27/2017  . Gastroesophageal reflux disease 11/27/2017  . Non-recurrent bilateral inguinal hernia without obstruction or gangrene 11/27/2017  . Abdominal aortic aneurysm (AAA) without rupture (HCC) 11/27/2017  . History of echocardiogram 11/27/2017  . Pulmonary hypertension (HCC) 11/27/2017  . Moderate mitral regurgitation by prior echocardiogram 11/27/2017  . Aortic valve sclerosis 11/27/2017  . Dyslipidemia, goal LDL below 70 11/22/2017  . Microscopic hematuria 11/22/2017  . Long term current use of anticoagulants with INR goal of 2.0-3.0 11/21/2017  . Nicotine dependence with current use 11/21/2017  . Uncontrolled stage 2 hypertension 11/21/2017  . History of blood clots   . Paroxysmal atrial fibrillation (HCC) 01/01/2017    Past Surgical History:  Procedure Laterality Date  . ATRIAL FIBRILLATION ABLATION    . MAZE  2010       Home Medications    Prior to Admission medications   Medication Sig Start Date End Date Taking? Authorizing Provider    albuterol (PROVENTIL HFA;VENTOLIN HFA) 108 (90 Base) MCG/ACT inhaler Inhale 1-2 puffs into the lungs every 4 (four) hours as needed for wheezing or shortness of breath. 11/21/17   Carlis Stable, PA-C  aspirin EC 81 MG tablet Take by mouth. 01/03/17   [provider]  budesonide-formoterol (SYMBICORT) 160-4.5 MCG/ACT inhaler Inhale 2 puffs into the lungs 2 (two) times daily. 11/21/17   Carlis Stable, PA-C  diltiazem (CARDIZEM CD) 180 MG 24 hr capsule Take 1 capsule (180 mg total) by mouth daily. 11/21/17   Carlis Stable, PA-C  diltiazem (DILACOR XR) 240 MG 24 hr capsule Take 1 capsule (240 mg total) by mouth daily. 01/16/18   Carlis Stable, PA-C  lisinopril (PRINIVIL,ZESTRIL) 20 MG tablet Take 1 tablet (20 mg total) by mouth daily. 01/16/18   Carlis Stable, PA-C  metoprolol succinate (TOPROL-XL) 100 MG 24 hr tablet Take 1 tablet (100 mg total) by mouth daily. Take with or immediately following a meal. 01/16/18   Carlis Stable, PA-C  omeprazole (PRILOSEC) 20 MG capsule Take 1 capsule (20 mg total) by mouth daily. 01/16/18   Carlis Stable, PA-C  pravastatin (PRAVACHOL) 40 MG tablet Take 1 tablet (40 mg total) by mouth at bedtime. 01/16/18   Carlis Stable, PA-C  warfarin (COUMADIN) 3 MG tablet Take 1 tablet (3 mg total) by mouth daily. 01/16/18   Carlis Stable, PA-C    Family History History reviewed. No pertinent family history.  Social History  Social History   Tobacco Use  . Smoking status: Current Every Day Smoker    Packs/day: 1.00    Years: 20.00    Pack years: 20.00    Types: Cigarettes  . Smokeless tobacco: Never Used  Substance Use Topics  . Alcohol use: Yes  . Drug use: No     Allergies   Patient has no known allergies.   Review of Systems Review of Systems  Respiratory: Positive for shortness of breath.   Cardiovascular: Negative for chest pain.   Neurological: Positive for dizziness, syncope and light-headedness.     Physical Exam Triage Vital Signs ED Triage Vitals [04/20/18 1143]  Enc Vitals Group     BP 116/70     Pulse Rate (!) 50     Resp      Temp 98 F (36.7 C)     Temp Source Oral     SpO2 100 %     Weight      Height      Head Circumference      Peak Flow      Pain Score 0     Pain Loc      Pain Edu?      Excl. in GC?    No data found.  Updated Vital Signs BP 116/70 (BP Location: Right Arm)   Pulse (!) 50   Temp 98 F (36.7 C) (Oral)   SpO2 100%   Visual Acuity Right Eye Distance:   Left Eye Distance:   Bilateral Distance:    Right Eye Near:   Left Eye Near:    Bilateral Near:     Physical Exam  Constitutional: He appears well-developed and well-nourished.  Eyes: Pupils are equal, round, and reactive to light.  Neck: Normal range of motion.  Cardiovascular:  Heart sounds are slow very irregular without murmur.  Pulmonary/Chest: Effort normal.   EKG shows atrial flutter with variable ventricular response heart rate to my calculation of 48.  UC Treatments / Results  Labs (all labs ordered are listed, but only abnormal results are displayed) Labs Reviewed - No data to display  EKG None shows atrial flutter with variable ventricular response slow heart rate  Radiology No results found.  Procedures Procedures (including critical care time)  Medications Ordered in UC Medications - No data to display  Initial Impression / Assessment and Plan / UC Course  I have reviewed the triage vital signs and the nursing notes.  Pertinent labs & imaging results that were available during my care of the patient were reviewed by me and considered in my medical decision making (see chart for details).     Patient with a history of atrial fibrillation status post ablation presents with episodes of dizziness and one episode of syncope.  EKG consistent with atrial flutter with variable ventricular  response.  EMS called [facility] evaluation. Final Clinical Impressions(s) / UC Diagnoses   Final diagnoses:  Atrial flutter, unspecified type (HCC)  Dizziness  Bradycardia     Discharge Instructions     Transported via EMS for evaluation.    ED Prescriptions    None     Controlled Substance Prescriptions [place] Controlled Substance Registry consulted? Not Applicable   Collene Gobbleaub, Steven A, MD 04/20/18 [phone]

## 2018-04-22 ENCOUNTER — Other Ambulatory Visit: Payer: Self-pay | Admitting: Physician Assistant

## 2018-04-22 DIAGNOSIS — Z7901 Long term (current) use of anticoagulants: Secondary | ICD-10-CM

## 2018-04-22 DIAGNOSIS — I480 Paroxysmal atrial fibrillation: Secondary | ICD-10-CM

## 2018-04-30 NOTE — Progress Notes (Signed)
Referring-Charley Doran HeaterElizabeth Cummings, PA-C Reason for referral-paroxysmal atrial fibrillation  HPI: 57 year old male for evaluation of paroxysmal atrial fibrillation at request of Carlis StableCharley Elizabeth Cummings, PA-C.  Patient has a history of atrial fibrillation and is status post ablation.  Patient was [facility] recently with syncope.  His AV nodal blocking agents had been increased for rate control of recurrent atrial flutter and he developed bradycardia.  Cardizem was continued but metoprolol was discontinued.  Echocardiogram August 2019 showed normal LV function, moderate left atrial enlargement.  Chest CT August 2019 showed a 6.2 x 1.7 cm peripheral opacity favoring atelectasis/scarring but neoplasm could not be excluded and he is scheduled to see pulmonary.  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  Dissection and thrombus could not be excluded and CTA recommended.  Discharge the patient has dyspnea with more moderate activities but not routine activities.  No orthopnea, PND, pedal edema, chest pain or syncope.  He occasionally has mild palpitations.  Current Outpatient Medications  Medication Sig Dispense Refill  . albuterol (PROVENTIL HFA;VENTOLIN HFA) 108 (90 Base) MCG/ACT inhaler Inhale 1-2 puffs into the lungs every 4 (four) hours as needed for wheezing or shortness of breath. 2 Inhaler 1  . aspirin EC 81 MG tablet Take by mouth.    . budesonide-formoterol (SYMBICORT) 160-4.5 MCG/ACT inhaler Inhale 2 puffs into the lungs 2 (two) times daily. 1 Inhaler 1  . lisinopril (PRINIVIL,ZESTRIL) 20 MG tablet Take 1 tablet (20 mg total) by mouth daily. 90 tablet 0  . omeprazole (PRILOSEC) 20 MG capsule Take 1 capsule (20 mg total) by mouth daily. 90 capsule 0  . warfarin (COUMADIN) 3 MG tablet TAKE 1 TABLET BY MOUTH ONCE DAILY 90 tablet 0   No current facility-administered medications for this visit.     No Known Allergies   Past Medical History:    Diagnosis Date  . A-fib (HCC)   . Atrial flutter (HCC)   . Carotid artery disease (HCC)    Carotid artery stenting   . COPD (chronic obstructive pulmonary disease) (HCC)   . CVA (cerebral vascular accident) (HCC)   . History of blood clots   . Hyperlipidemia   . Hypertension   . Lung nodule   . Microscopic hematuria 11/22/2017  . Thoracic aortic aneurysm (HCC)   . Tobacco use disorder     Past Surgical History:  Procedure Laterality Date  . ATRIAL FIBRILLATION ABLATION    . MAZE  2010    Social History   Socioeconomic History  . Marital status: Divorced    Spouse name: Not on file  . Number of children: 3  . Years of education: Not on file  . Highest education level: Not on file  Occupational History  . Not on file  Social Needs  . Financial resource strain: Not on file  . Food insecurity:    Worry: Not on file    Inability: Not on file  . Transportation needs:    Medical: Not on file    Non-medical: Not on file  Tobacco Use  . Smoking status: Current Every Day Smoker    Packs/day: 1.00    Years: 20.00    Pack years: 20.00    Types: Cigarettes  . Smokeless tobacco: Never Used  Substance and Sexual Activity  . Alcohol use: Yes    Comment: Occasional  . Drug use: No  . Sexual activity: Not Currently  Lifestyle  . Physical activity:    Days per  week: Not on file    Minutes per session: Not on file  . Stress: Not on file  Relationships  . Social connections:    Talks on phone: Not on file    Gets together: Not on file    Attends religious service: Not on file    Active member of club or organization: Not on file    Attends meetings of clubs or organizations: Not on file    Relationship status: Not on file  . Intimate partner violence:    Fear of current or ex partner: Not on file    Emotionally abused: Not on file    Physically abused: Not on file    Forced sexual activity: Not on file  Other Topics Concern  . Not on file  Social History Narrative   . Not on file    Family History  Problem Relation Age of Onset  . Atrial fibrillation Father   . Atrial fibrillation Sister     ROS: no fevers or chills, productive cough, hemoptysis, dysphasia, odynophagia, melena, hematochezia, dysuria, hematuria, rash, seizure activity, orthopnea, PND, pedal edema, claudication. Remaining systems are negative.  Physical Exam:   Blood pressure (!) 136/93, pulse (!) 110, height 6\' 1"  (1.854 m), weight 180 lb 6.4 oz (81.8 kg).  General:  Well developed/well nourished in NAD Skin warm/dry Patient not depressed No peripheral clubbing Back-normal HEENT-normal/normal eyelids Neck supple/normal carotid upstroke bilaterally; no bruits; no JVD; no thyromegaly chest -diminished breath sounds CV -regular and tachycardic.  No murmurs. Abdomen -NT/ND, no HSM, no mass, + bowel sounds, no bruit 2+ femoral pulse on left and 1+ on right, no bruits Ext-no edema, chords, 2+ DP Neuro-grossly nonfocal  ECG -atrial flutter with nonspecific ST changes.  Personally reviewed  A/P  1 atrial flutter-patient has a history of atrial fibrillation.  [place]  [place]  He did well until recently when he was found to be in recurrent atrial flutter.  However he is relatively asymptomatic.  Rate is mildly elevated.  I will increase his Cardizem to 300 mg daily and follow.  Note with a combination of Cardizem and metoprolol 100 mg daily he had bradycardia and syncope.  Recent echocardiogram shows normal LV function. CHADsvasc 4.  We will continue Coumadin.  We discussed DOACs.  Will contact his insurance company and if affordable we will change to apixaban.  I will see him back in 3 months.  If he remains in atrial fibrillation we will consider cardioversion with rate control in the future if atrial arrhythmias recur as long as he is asymptomatic.  He is hesitant to proceed with cardioversion at  this time.  2 carotid artery disease-he has had previous left carotid artery stent by his report.  Continue statin.  Repeat carotid Dopplers.  3 thoracic/celiac aneurysms-schedule CTA of thoracic and abdominal aorta to further assess.  4 hypertension-blood pressure is mildly elevated.  Increase Cardizem to 300 mg daily and follow.  5 hyperlipidemia-given known vascular disease I will discontinue pravastatin and instead treat with Crestor 40 mg daily.  Check lipids and liver in 4 weeks.  5 tobacco abuse-patient counseled on discontinuing.  6 pulmonary mass-follow-up pulmonary as scheduled.  Olga MillersBrian Altin Sease, MD

## 2018-05-01 ENCOUNTER — Ambulatory Visit: Payer: Medicare Other | Admitting: Cardiology

## 2018-05-01 ENCOUNTER — Encounter: Payer: Self-pay | Admitting: Cardiology

## 2018-05-01 VITALS — BP 136/93 | HR 110 | Ht 73.0 in | Wt 180.4 lb

## 2018-05-01 DIAGNOSIS — I712 Thoracic aortic aneurysm, without rupture, unspecified: Secondary | ICD-10-CM

## 2018-05-01 DIAGNOSIS — I480 Paroxysmal atrial fibrillation: Secondary | ICD-10-CM

## 2018-05-01 DIAGNOSIS — I10 Essential (primary) hypertension: Secondary | ICD-10-CM

## 2018-05-01 DIAGNOSIS — I679 Cerebrovascular disease, unspecified: Secondary | ICD-10-CM | POA: Diagnosis not present

## 2018-05-01 DIAGNOSIS — I728 Aneurysm of other specified arteries: Secondary | ICD-10-CM

## 2018-05-01 DIAGNOSIS — E7800 Pure hypercholesterolemia, unspecified: Secondary | ICD-10-CM

## 2018-05-01 MED ORDER — DILTIAZEM HCL ER COATED BEADS 300 MG PO CP24
300.0000 mg | ORAL_CAPSULE | Freq: Every day | ORAL | 3 refills | Status: DC
Start: 2018-05-01 — End: 2018-08-12

## 2018-05-01 MED ORDER — ROSUVASTATIN CALCIUM 40 MG PO TABS: 40.0000 mg | ORAL_TABLET | Freq: Every day | ORAL | 3 refills | Status: DC

## 2018-05-01 NOTE — Patient Instructions (Signed)
Medication Instructions:   INCREASE DILTIAZEM TO 300 MG ONCE DAILY  STOP PRAVASTATIN  START ROSUVASTATIN 40 MG ONCE DAILY  PRADAXA OR ELIQUIS OR XARELTO TO REPLACE WARFARIN  Labwork:  Your physician recommends that you return for lab work in: 4 WEEKS PRIOR TO EATING  Testing/Procedures:  Your physician has requested that you have a carotid duplex. This test is an ultrasound of the carotid arteries in your neck. It looks at blood flow through these arteries that supply the brain with blood. Allow one hour for this exam. There are no restrictions or special instructions.   CTA OF THE CHEST AND ABDOMEN TO FOLLOW UP ANEURYSMS  Follow-Up:  Your physician recommends that you schedule a follow-up appointment in: 3 MONTHS WITH DR Jens SomRENSHAW

## 2018-05-02 ENCOUNTER — Ambulatory Visit (HOSPITAL_BASED_OUTPATIENT_CLINIC_OR_DEPARTMENT_OTHER)
Admission: RE | Admit: 2018-05-02 | Discharge: 2018-05-02 | Disposition: A | Discharge status: Home / Self Care | Payer: Medicare Other | Source: Ambulatory Visit | Attending: Cardiology | Admitting: Cardiology

## 2018-05-02 ENCOUNTER — Encounter (HOSPITAL_BASED_OUTPATIENT_CLINIC_OR_DEPARTMENT_OTHER): Payer: Self-pay

## 2018-05-02 DIAGNOSIS — I679 Cerebrovascular disease, unspecified: Secondary | ICD-10-CM | POA: Insufficient documentation

## 2018-05-02 DIAGNOSIS — I712 Thoracic aortic aneurysm, without rupture, unspecified: Secondary | ICD-10-CM

## 2018-05-02 DIAGNOSIS — I728 Aneurysm of other specified arteries: Secondary | ICD-10-CM | POA: Diagnosis present

## 2018-05-02 DIAGNOSIS — Z95828 Presence of other vascular implants and grafts: Secondary | ICD-10-CM | POA: Insufficient documentation

## 2018-05-02 MED ORDER — IOPAMIDOL (ISOVUE-370) INJECTION 76%
100.0000 mL | Freq: Once | INTRAVENOUS | Status: AC | PRN
  Administered 2018-05-02: 100 mL via INTRAVENOUS

## 2018-05-06 ENCOUNTER — Ambulatory Visit (INDEPENDENT_AMBULATORY_CARE_PROVIDER_SITE_OTHER): Payer: Medicare Other | Admitting: Physician Assistant

## 2018-05-06 ENCOUNTER — Encounter: Payer: Self-pay | Admitting: Physician Assistant

## 2018-05-06 VITALS — BP 155/116 | HR 109 | Wt 181.0 lb

## 2018-05-06 DIAGNOSIS — Z23 Encounter for immunization: Secondary | ICD-10-CM

## 2018-05-06 DIAGNOSIS — I480 Paroxysmal atrial fibrillation: Secondary | ICD-10-CM

## 2018-05-06 DIAGNOSIS — I10 Essential (primary) hypertension: Secondary | ICD-10-CM | POA: Diagnosis not present

## 2018-05-06 DIAGNOSIS — Z09 Encounter for follow-up examination after completed treatment for conditions other than malignant neoplasm: Secondary | ICD-10-CM | POA: Diagnosis not present

## 2018-05-06 DIAGNOSIS — K219 Gastro-esophageal reflux disease without esophagitis: Secondary | ICD-10-CM

## 2018-05-06 DIAGNOSIS — I7121 Aneurysm of the ascending aorta, without rupture: Secondary | ICD-10-CM

## 2018-05-06 DIAGNOSIS — I723 Aneurysm of iliac artery: Secondary | ICD-10-CM

## 2018-05-06 DIAGNOSIS — I712 Thoracic aortic aneurysm, without rupture: Secondary | ICD-10-CM

## 2018-05-06 DIAGNOSIS — I771 Stricture of artery: Secondary | ICD-10-CM

## 2018-05-06 DIAGNOSIS — Z7901 Long term (current) use of anticoagulants: Secondary | ICD-10-CM

## 2018-05-06 DIAGNOSIS — K449 Diaphragmatic hernia without obstruction or gangrene: Secondary | ICD-10-CM

## 2018-05-06 DIAGNOSIS — N281 Cyst of kidney, acquired: Secondary | ICD-10-CM

## 2018-05-06 LAB — POCT INR: INR: 2.3 (ref 2.0–3.0)

## 2018-05-06 MED ORDER — OMEPRAZOLE 20 MG PO CPDR: 20.0000 mg | DELAYED_RELEASE_CAPSULE | Freq: Every day | ORAL | 0 refills | Status: DC

## 2018-05-06 NOTE — Progress Notes (Signed)
HPI:                                                                Jeremiah Delacruz is a 57 y.o. male who presents to [facility] today for hospital discharge follow-up  This is a 58 yo M with complex PMH refractory aFib/aFlutter s/p MAZE and ablation on coumadin, aortic aneurysm repair, COPD, current tobacco abuse, HTN, HLD who was admitted to [facility] for atrial flutter with associated dizziness/orthostasis. This was felt to be due to antihypertensive medication. His Metoprolol and Lisinopril were discontinued and his Diltiazem was continued at 240 mg.  While in the hospital he underwent an echo and CT chest, which revealed some incidental findings requiring follow-up (see below)  He followed up with cardiology on 05/01/18, and Diltiazem was increased to 300 mg daily. 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  He has a follow-up appt scheduled with Pulmonology to discuss LLL opacity.  He is otherwise feeling well. Denies constitutional symptoms, chest pain, dyspnea, productive cough,  Prior Work-ups --04/21/2018 TTE: EF 55-60%, moderate concentric left ventricular hypertrophy, left atrium is moderately dilated, no pericardial effusion. 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 Dissection and thrombus cannot be ruled out on this noncontrast study. Recommended CTA abdomen for further evaluation. A 6.2 x 1.7 cm peripheral opacity in the superior segment of the left lower lobe with volume loss and traction bronchiectasis favoring atelectasis/scarring and less likely to be loculated pleural effusion. Cannot entirely exclude a neoplasm.    Depression screen PHQ 2/9 11/21/2017  Decreased Interest 0  Down, Depressed, Hopeless 0  PHQ - 2 Score 0    No flowsheet data found.    Past Medical  History:  Diagnosis Date  . A-fib (HCC)   . Atrial flutter (HCC)   . Carotid artery disease (HCC)    Carotid artery stenting   . COPD (chronic obstructive pulmonary disease) (HCC)   . CVA (cerebral vascular accident) (HCC)   . History of blood clots   . Hyperlipidemia   . Hypertension   . Lung nodule   . Microscopic hematuria 11/22/2017  . Thoracic aortic aneurysm (HCC)   . Tobacco use disorder    Past Surgical History:  Procedure Laterality Date  . ATRIAL FIBRILLATION ABLATION    . MAZE  2010   Social History   Tobacco Use  . Smoking status: Current Every Day Smoker    Packs/day: 1.00    Years: 20.00    Pack years: 20.00    Types: Cigarettes  . Smokeless tobacco: Never Used  Substance Use Topics  . Alcohol use: Yes    Comment: Occasional   family history includes Atrial fibrillation in his father and sister.    ROS: negative except as noted in the HPI  Medications: Current Outpatient Medications  Medication Sig Dispense Refill  . albuterol (PROVENTIL HFA;VENTOLIN HFA) 108 (90 Base) MCG/ACT inhaler Inhale 1-2 puffs into the lungs every 4 (four) hours as needed for wheezing or shortness of breath. 2 Inhaler 1  . aspirin EC 81 MG tablet Take by mouth.    . budesonide-formoterol (  SYMBICORT) 160-4.5 MCG/ACT inhaler Inhale 2 puffs into the lungs 2 (two) times daily. 1 Inhaler 1  . diltiazem (DILTIAZEM CD) 300 MG 24 hr capsule Take 1 capsule (300 mg total) by mouth daily. 90 capsule 3  . omeprazole (PRILOSEC) 20 MG capsule Take 1 capsule (20 mg total) by mouth daily. 90 capsule 0  . rosuvastatin (CRESTOR) 40 MG tablet Take 1 tablet (40 mg total) by mouth daily. 90 tablet 3  . warfarin (COUMADIN) 3 MG tablet TAKE 1 TABLET BY MOUTH ONCE DAILY 90 tablet 0   No current facility-administered medications for this visit.    No Known Allergies     Objective:  BP (!) 155/116   Pulse (!) 109   Wt 181 lb (82.1 kg)   SpO2 99%   BMI 23.88 kg/m  Gen:  alert, not  ill-appearing, no distress, appropriate for age HEENT: head normocephalic without obvious abnormality, conjunctiva and cornea clear, trachea midline Pulm: Normal work of breathing, normal phonation, clear to auscultation bilaterally, no wheezes, rales or rhonchi CV: tachycardic at 103-110, regular rhythm, s1 and s2 distinct, no murmurs, clicks or rubs  Neuro: alert and oriented x 3, no tremor MSK: extremities atraumatic, normal gait and station Skin: intact, no rashes on exposed skin, no jaundice, no cyanosis Psych: well-groomed, cooperative, good eye contact, euthymic mood, affect mood-congruent, speech is articulate, and thought processes clear and goal-directed    No results found for this or any previous visit (from the past 72 hour(s)). US Carotid Duplex Bilateral  Result Date: 05/03/2018 CLINICAL DATA:  Follow-up left-sided carotid stent placed several years ago in [place]. History of hypertension, hyperlipidemia and smoking. EXAM: BILATERAL CAROTID DUPLEX ULTRASOUND TECHNIQUE: Wallace Cullens scale imaging, color Doppler and duplex ultrasound were performed of bilateral carotid and vertebral arteries in the neck. COMPARISON:  None. FINDINGS: Criteria: Quantification of carotid stenosis is based on velocity parameters that correlate the residual internal carotid diameter with NASCET-based stenosis levels, using the diameter of the distal internal carotid lumen as the denominator for stenosis measurement. The following velocity measurements were obtained: RIGHT ICA:  68/23 cm/sec CCA:  53/15 cm/sec SYSTOLIC ICA/CCA RATIO:  1.3 DIASTOLIC ICA/CCA RATIO:  1.6 ECA:  65 cm/sec LEFT ICA:  549/29 cm/sec CCA:  72/26 cm/sec SYSTOLIC ICA/CCA RATIO:  0.7 DIASTOLIC ICA/CCA RATIO:  1.1 ECA:  54 cm/sec RIGHT CAROTID ARTERY: There is a moderate amount of hypoechoic plaque within the right carotid bulb (image 17), extending to involve the origin and proximal aspects of the right internal carotid artery (image 24), not  resulting in elevated peak systolic velocities within the interrogated course of the right internal carotid artery to suggest a hemodynamically significant stenosis. RIGHT VERTEBRAL ARTERY:  Not visualized LEFT CAROTID ARTERY: There is an apparent stent is seen involving the interrogated portions of the left internal carotid artery. There is a very minimal amount of echogenic plaque involving the proximal aspect of the left internal carotid artery (image 58), not resulting in a hemodynamically significant stenosis. LEFT VERTEBRAL ARTERY:  Antegrade flow IMPRESSION: 1. Very minimal amount atherosclerotic plaque within the caudal aspect of the left ICA stent, not resulting in a hemodynamically significant stenosis. 2. Moderate amount of right-sided atherosclerotic plaque, not resulting in a hemodynamically significant stenosis. 3. Non visualization of a patent right vertebral artery of uncertain chronicity or clinical significance. 4. Antegrade flow demonstrated within left vertebral artery. Electronically Signed   By: Simonne Come M.D.   On: 05/03/2018 09:29   Ct Angio Chest Aorta W &/  or Wo Contrast  Result Date: 05/03/2018 CLINICAL DATA:  Atrial fibrillation. Thoracic aortic aneurysm without rupture. EXAM: CT ANGIOGRAPHY CHEST, ABDOMEN AND PELVIS TECHNIQUE: Multidetector CT imaging through the chest, abdomen and pelvis was performed using the standard protocol during bolus administration of intravenous contrast. Multiplanar reconstructed images and MIPs were obtained and reviewed to evaluate the vascular anatomy. CONTRAST:  ISOVUE-370 IOPAMIDOL (ISOVUE-370) INJECTION 76% COMPARISON:  None. FINDINGS: CTA CHEST FINDINGS Cardiovascular: 4.1 cm ascending thoracic aortic aneurysm is noted without dissection. Great vessels are widely patent without significant stenosis. Normal cardiac size without pericardial effusion. Mediastinum/Nodes: No enlarged mediastinal, hilar, or axillary lymph nodes. Thyroid gland and  trachea demonstrate no significant findings. Small sliding-type hiatal hernia is noted. Lungs/Pleura: No pneumothorax or pleural effusion is noted. Right lung is clear. Scarring is noted anteriorly in left upper lobe. Large crescent shaped density is noted posteriorly in the left lower lobe which is most consistent with scarring or rounded atelectasis although neoplasm cannot be excluded. Musculoskeletal: No chest wall abnormality. No acute or significant osseous findings. Review of the MIP images confirms the above findings. CTA ABDOMEN AND PELVIS FINDINGS VASCULAR Aorta: Atherosclerosis of abdominal aorta is noted without aneurysm or dissection. Celiac: Moderate stenosis is noted proximally with poststenotic dilatation measuring 14 mm in diameter. SMA: No significant stenosis is noted proximally without evidence of thrombus. 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 IMA: Not visualized and presumably occluded. Inflow: Bilobed aneurysm is seen involving the distal right common iliac artery with maximum measured diameter 3.3 cm. Severe focal stenosis is seen at the origin of the right external iliac artery. Status post surgical bypass graft extending from right external iliac artery to common femoral artery which is widely patent. 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 Moderate stenosis is noted at the origin of the left external iliac artery. Veins: No obvious venous abnormality within the limitations of this arterial phase study. Review of the MIP images confirms the above findings. NON-VASCULAR Hepatobiliary: No focal liver abnormality is seen. Status post cholecystectomy. No biliary dilatation. Pancreas: Unremarkable. No pancreatic ductal dilatation or surrounding inflammatory changes. Spleen: Calcified splenic granulomata  are noted. Spleen is normal in size. Adrenals/Urinary Tract: Adrenal glands appear normal. 2.5 cm cyst is seen in lower pole of right kidney. 12 mm exophytic abnormality is seen arising posteriorly from upper pole of right kidney with Hounsfield measurement of 79. Similar but smaller abnormality seen arising posteriorly from lower pole of left kidney measuring 9 mm. It is uncertain if these represent hyperdense cysts or possibly possible neoplasm. Renal ultrasound is recommended for further evaluation. No hydronephrosis or renal obstruction is noted. No renal or ureteral calculi are noted. Urinary bladder is unremarkable. Stomach/Bowel: Stomach is within normal limits. Appendix appears normal. No evidence of bowel wall thickening, distention, or inflammatory changes. Lymphatic: No significant adenopathy is noted. Reproductive: Mild prostatic enlargement is noted. Other: No abdominal wall hernia or abnormality. No abdominopelvic ascites. Musculoskeletal: No acute or significant osseous findings. Review of the MIP images confirms the above findings. IMPRESSION: 4.1 cm ascending thoracic aortic aneurysm. Recommend annual imaging followup by CTA or MRA. This recommendation follows 2010 ACCF/AHA/AATS/ACR/ASA/SCA/SCAI/SIR/STS/SVM Guidelines for the Diagnosis and Management of Patients with Thoracic Aortic Disease. Circulation. 2010; 121: [id number]. 12 mm exophytic rounded abnormality arising posteriorly from lower pole of right kidney with similar but smaller 9 mm  abnormality arising posteriorly from the left kidney. These may simply represent hyperdense cysts, but renal ultrasound is recommended to rule out neoplasm or mass. Small sliding-type hiatal hernia. Large crescent-shaped density is noted posteriorly in left lower lobe most consistent with scarring or some degree of rounded atelectasis. Neoplasm cannot be excluded however, and follow-up CT scan in 6 months is recommended to ensure stability. No evidence of  abdominal aortic aneurysm or dissection. Moderate stenosis is noted at origin of celiac artery with poststenotic dilatation. 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 Severe stenosis is noted at the origin of the right external iliac artery. Status post surgical bypass of distal right external iliac artery to common femoral artery which is widely patent. 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 Mild prostatic enlargement. Aortic Atherosclerosis (ICD10-I70.0). Electronically Signed   By: Lupita Raider, M.D.   On: 05/03/2018 09:23   Ct Angio Abd/pel W/ And/or W/o  Result Date: 05/03/2018 CLINICAL DATA:  Atrial fibrillation. Thoracic aortic aneurysm without rupture. EXAM: CT ANGIOGRAPHY CHEST, ABDOMEN AND PELVIS TECHNIQUE: Multidetector CT imaging through the chest, abdomen and pelvis was performed using the standard protocol during bolus administration of intravenous contrast. Multiplanar reconstructed images and MIPs were obtained and reviewed to evaluate the vascular anatomy. CONTRAST:  ISOVUE-370 IOPAMIDOL (ISOVUE-370) INJECTION 76% COMPARISON:  None. FINDINGS: CTA CHEST FINDINGS Cardiovascular: 4.1 cm ascending thoracic aortic aneurysm is noted without dissection. Great vessels are widely patent without significant stenosis. Normal cardiac size without pericardial effusion. Mediastinum/Nodes: No enlarged mediastinal, hilar, or axillary lymph nodes. Thyroid gland and trachea demonstrate no significant findings. Small sliding-type hiatal hernia is noted. Lungs/Pleura: No pneumothorax or pleural effusion is noted. Right lung is clear. Scarring is noted anteriorly in left upper lobe. Large crescent shaped density is noted posteriorly in the left lower lobe which is most consistent with scarring or  rounded atelectasis although neoplasm cannot be excluded. Musculoskeletal: No chest wall abnormality. No acute or significant osseous findings. Review of the MIP images confirms the above findings. CTA ABDOMEN AND PELVIS FINDINGS VASCULAR Aorta: Atherosclerosis of abdominal aorta is noted without aneurysm or dissection. Celiac: Moderate stenosis is noted proximally with poststenotic dilatation measuring 14 mm in diameter. SMA: No significant stenosis is noted proximally without evidence of thrombus. 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 IMA: Not visualized and presumably occluded. Inflow: Bilobed aneurysm is seen involving the distal right common iliac artery with maximum measured diameter 3.3 cm. Severe focal stenosis is seen at the origin of the right external iliac artery. Status post surgical bypass graft extending from right external iliac artery to common femoral artery which is widely patent. 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 Moderate stenosis is noted at the origin of the left external iliac artery. Veins: No obvious venous abnormality within the limitations of this arterial phase study. Review of the MIP images confirms the above findings. NON-VASCULAR Hepatobiliary: No focal liver abnormality is seen. Status post cholecystectomy. No biliary dilatation. Pancreas: Unremarkable. No pancreatic ductal dilatation or surrounding inflammatory changes. Spleen: Calcified splenic granulomata are noted. Spleen is normal in size. Adrenals/Urinary Tract: Adrenal glands appear normal. 2.5 cm cyst is seen  in lower pole of right kidney. 12 mm exophytic abnormality is seen arising posteriorly from upper pole of right kidney with Hounsfield measurement of 79. Similar but smaller abnormality seen arising posteriorly from lower  pole of left kidney measuring 9 mm. It is uncertain if these represent hyperdense cysts or possibly possible neoplasm. Renal ultrasound is recommended for further evaluation. No hydronephrosis or renal obstruction is noted. No renal or ureteral calculi are noted. Urinary bladder is unremarkable. Stomach/Bowel: Stomach is within normal limits. Appendix appears normal. No evidence of bowel wall thickening, distention, or inflammatory changes. Lymphatic: No significant adenopathy is noted. Reproductive: Mild prostatic enlargement is noted. Other: No abdominal wall hernia or abnormality. No abdominopelvic ascites. Musculoskeletal: No acute or significant osseous findings. Review of the MIP images confirms the above findings. IMPRESSION: 4.1 cm ascending thoracic aortic aneurysm. Recommend annual imaging followup by CTA or MRA. This recommendation follows 2010 ACCF/AHA/AATS/ACR/ASA/SCA/SCAI/SIR/STS/SVM Guidelines for the Diagnosis and Management of Patients with Thoracic Aortic Disease. Circulation. 2010; 121: [id number]. 12 mm exophytic rounded abnormality arising posteriorly from lower pole of right kidney with similar but smaller 9 mm abnormality arising posteriorly from the left kidney. These may simply represent hyperdense cysts, but renal ultrasound is recommended to rule out neoplasm or mass. Small sliding-type hiatal hernia. Large crescent-shaped density is noted posteriorly in left lower lobe most consistent with scarring or some degree of rounded atelectasis. Neoplasm cannot be excluded however, and follow-up CT scan in 6 months is recommended to ensure stability. No evidence of abdominal aortic aneurysm or dissection. Moderate stenosis is noted at origin of celiac artery with poststenotic dilatation. 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 Severe stenosis is noted at the origin of the right  external iliac artery. Status post surgical bypass of distal right external iliac artery to common femoral artery which is widely patent. 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 Mild prostatic enlargement. Aortic Atherosclerosis (ICD10-I70.0). Electronically Signed   By: Lupita RaiderJames  Green Jr, M.D.   On: 05/03/2018 09:23      Assessment and Plan: 57 y.o. male with   .Jeremiah Delacruz was seen today for hospitalization follow-up.  Diagnoses and all orders for this visit:  Hospital discharge follow-up  Uncontrolled stage 2 hypertension  Gastroesophageal reflux disease without esophagitis -     omeprazole (PRILOSEC) 20 MG capsule; Take 1 capsule (20 mg total) by mouth daily.  Hiatal hernia with GERD  Need for immunization against influenza -     Flu Vaccine QUAD 36+ mos IM  Renal cysts, acquired, bilateral -     US Renal; Future  Paroxysmal atrial fibrillation (HCC) -     POCT INR  Long term current use of anticoagulants with INR goal of 2.0-3.0 -     POCT INR   INR therapeutic today at 2.3. Cont Coumadin 3 mg daily. Cardiology has started the PA process for Apixaban He declined cardioversion Keep f/u with Cardiology  BP elevated today, mildly tachycardic, asymptomatic. Cardizem was recently increased to 300 mg. He had orthostasis with combination of ACE and beta blocker. We may need to add back low-dose ACE for BP control. Recheck nurse visit in 2 weeks  Declines smoking cessation  Personally reviewed results of recent imaging studies with patient and answered all questions. 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 Cummings PA-C

## 2018-05-06 NOTE — Patient Instructions (Signed)
Food Choices for Gastroesophageal Reflux Disease, Adult When you have gastroesophageal reflux disease (GERD), the foods you eat and your eating habits are very important. Choosing the right foods can help ease the discomfort of GERD. Consider working with a diet and nutrition specialist (dietitian) to help you make healthy food choices. What general guidelines should I follow? Eating plan  Choose healthy foods low in fat, such as fruits, vegetables, whole grains, low-fat dairy products, and lean meat, fish, and poultry.  Eat frequent, small meals instead of three large meals each day. Eat your meals slowly, in a relaxed setting. Avoid bending over or lying down until 2-3 hours after eating.  Limit high-fat foods such as fatty meats or fried foods.  Limit your intake of oils, butter, and shortening to less than 8 teaspoons each day.  Avoid the following: ? Foods that cause symptoms. These may be different for different people. Keep a food diary to keep track of foods that cause symptoms. ? Alcohol. ? Drinking large amounts of liquid with meals. ? Eating meals during the 2-3 hours before bed.  Cook foods using methods other than frying. This may include baking, grilling, or broiling. Lifestyle   Maintain a healthy weight. Ask your health care provider what weight is healthy for you. If you need to lose weight, work with your health care provider to do so safely.  Exercise for at least 30 minutes on 5 or more days each week, or as told by your health care provider.  Avoid wearing clothes that fit tightly around your waist and chest.  Do not use any products that contain nicotine or tobacco, such as cigarettes and e-cigarettes. If you need help quitting, ask your health care provider.  Sleep with the head of your bed raised. Use a wedge under the mattress or blocks under the bed frame to raise the head of the bed. What foods are not recommended? The items listed may not be a complete  list. Talk with your dietitian about what dietary choices are best for you. Grains Pastries or quick breads with added fat. French toast. Vegetables Deep fried vegetables. French fries. Any vegetables prepared with added fat. Any vegetables that cause symptoms. For some people this may include tomatoes and tomato products, chili peppers, onions and garlic, and horseradish. Fruits Any fruits prepared with added fat. Any fruits that cause symptoms. For some people this may include citrus fruits, such as oranges, grapefruit, pineapple, and lemons. Meats and other protein foods High-fat meats, such as fatty beef or pork, hot dogs, ribs, ham, sausage, salami and bacon. Fried meat or protein, including fried fish and fried chicken. Nuts and nut butters. Dairy Whole milk and chocolate milk. Sour cream. Cream. Ice cream. Cream cheese. Milk shakes. Beverages Coffee and tea, with or without caffeine. Carbonated beverages. Sodas. Energy drinks. Fruit juice made with acidic fruits (such as orange or grapefruit). Tomato juice. Alcoholic drinks. Fats and oils Butter. Margarine. Shortening. Ghee. Sweets and desserts Chocolate and cocoa. Donuts. Seasoning and other foods Pepper. Peppermint and spearmint. Any condiments, herbs, or seasonings that cause symptoms. For some people, this may include curry, hot sauce, or vinegar-based salad dressings. Summary  When you have gastroesophageal reflux disease (GERD), food and lifestyle choices are very important to help ease the discomfort of GERD.  Eat frequent, small meals instead of three large meals each day. Eat your meals slowly, in a relaxed setting. Avoid bending over or lying down until 2-3 hours after eating.  Limit high-fat   foods such as fatty meat or fried foods. This information is not intended to replace advice given to you by your health care provider. Make sure you discuss any questions you have with your health care provider. Document Released:  09/04/2005 Document Revised: 09/05/2016 Document Reviewed: 09/05/2016 Elsevier Interactive Patient Education  2018 [employer]  

## 2018-05-07 ENCOUNTER — Other Ambulatory Visit: Payer: Self-pay | Admitting: *Deleted

## 2018-05-07 ENCOUNTER — Ambulatory Visit: Payer: Medicare Other

## 2018-05-07 ENCOUNTER — Other Ambulatory Visit: Payer: Medicare Other

## 2018-05-07 DIAGNOSIS — I679 Cerebrovascular disease, unspecified: Secondary | ICD-10-CM

## 2018-05-08 ENCOUNTER — Other Ambulatory Visit: Payer: Medicare Other

## 2018-05-09 ENCOUNTER — Other Ambulatory Visit: Payer: Medicare Other

## 2018-05-15 ENCOUNTER — Encounter: Payer: Self-pay | Admitting: Physician Assistant

## 2018-05-15 DIAGNOSIS — N281 Cyst of kidney, acquired: Secondary | ICD-10-CM | POA: Insufficient documentation

## 2018-05-15 DIAGNOSIS — I771 Stricture of artery: Secondary | ICD-10-CM | POA: Insufficient documentation

## 2018-05-15 DIAGNOSIS — I712 Thoracic aortic aneurysm, without rupture: Secondary | ICD-10-CM | POA: Insufficient documentation

## 2018-05-15 DIAGNOSIS — I723 Aneurysm of iliac artery: Secondary | ICD-10-CM | POA: Insufficient documentation

## 2018-05-15 DIAGNOSIS — I7121 Aneurysm of the ascending aorta, without rupture: Secondary | ICD-10-CM | POA: Insufficient documentation

## 2018-05-22 ENCOUNTER — Ambulatory Visit (INDEPENDENT_AMBULATORY_CARE_PROVIDER_SITE_OTHER): Payer: Medicare Other | Admitting: Physician Assistant

## 2018-05-22 DIAGNOSIS — I480 Paroxysmal atrial fibrillation: Secondary | ICD-10-CM

## 2018-05-22 DIAGNOSIS — Z7901 Long term (current) use of anticoagulants: Secondary | ICD-10-CM | POA: Diagnosis not present

## 2018-05-22 LAB — POCT INR: INR: 2.6 (ref 2.0–3.0)

## 2018-05-22 MED ORDER — LISINOPRIL 10 MG PO TABS: 10.0000 mg | ORAL_TABLET | Freq: Every day | ORAL | 1 refills | Status: DC

## 2018-06-05 ENCOUNTER — Ambulatory Visit: Payer: Medicare Other | Admitting: Physician Assistant

## 2018-06-05 ENCOUNTER — Encounter: Payer: Self-pay | Admitting: Physician Assistant

## 2018-06-05 ENCOUNTER — Ambulatory Visit (INDEPENDENT_AMBULATORY_CARE_PROVIDER_SITE_OTHER): Payer: Medicare Other | Admitting: Physician Assistant

## 2018-06-05 VITALS — BP 123/77 | HR 70 | Wt 183.0 lb

## 2018-06-05 DIAGNOSIS — I480 Paroxysmal atrial fibrillation: Secondary | ICD-10-CM | POA: Diagnosis not present

## 2018-06-05 DIAGNOSIS — I10 Essential (primary) hypertension: Secondary | ICD-10-CM

## 2018-06-05 LAB — POCT INR: INR: 2.9 (ref 2.0–3.0)

## 2018-06-05 MED ORDER — APIXABAN 5 MG PO TABS: 5.0000 mg | ORAL_TABLET | Freq: Two times a day (BID) | ORAL | 0 refills | Status: DC

## 2018-06-05 MED ORDER — LISINOPRIL 10 MG PO TABS: 10.0000 mg | ORAL_TABLET | Freq: Every day | ORAL | 1 refills | Status: DC

## 2018-06-05 NOTE — Progress Notes (Signed)
HPI:                                                                Jeremiah Delacruz is a 57 y.o. male who [facility] today for hypertension follow-up   This is a 57 yo M with complex PMH refractory aFib/aFlutter s/p MAZE and ablation on coumadin, aortic aneurysm repair, COPD, current tobacco abuse, HTN, HLD  BP was not controlled on Diltiazem 300 mg daily, so Lisinopril 10 mg was added back. He has not had any additional dizziness. He does not monitor his BP at home.  Denies vision change, headache, chest pain with exertion, orthopnea, lightheadedness, syncope and edema.   No flowsheet data found.    Past Medical History:  Diagnosis Date  . A-fib (HCC)   . Atrial flutter (HCC)   . Carotid artery disease (HCC)    Carotid artery stenting   . COPD (chronic obstructive pulmonary disease) (HCC)   . CVA (cerebral vascular accident) (HCC)   . History of blood clots   . Hyperlipidemia   . Hypertension   . Lung nodule   . Microscopic hematuria 11/22/2017  . Thoracic aortic aneurysm (HCC)   . Tobacco use disorder    Past Surgical History:  Procedure Laterality Date  . ATRIAL FIBRILLATION ABLATION    . MAZE  2010   Social History   Tobacco Use  . Smoking status: Current Every Day Smoker    Packs/day: 1.00    Years: 20.00    Pack years: 20.00    Types: Cigarettes  . Smokeless tobacco: Never Used  Substance Use Topics  . Alcohol use: Yes    Comment: Occasional   family history includes Atrial fibrillation in his father and sister.    ROS: negative except as noted in the HPI  Medications: Current Outpatient Medications  Medication Sig Dispense Refill  . albuterol (PROVENTIL HFA;VENTOLIN HFA) 108 (90 Base) MCG/ACT inhaler Inhale 1-2 puffs into the lungs every 4 (four) hours as needed for wheezing or shortness of breath. 2 Inhaler 1  . aspirin EC 81 MG tablet Take by mouth.    . budesonide-formoterol (SYMBICORT)  160-4.5 MCG/ACT inhaler Inhale 2 puffs into the lungs 2 (two) times daily. 1 Inhaler 1  . diltiazem (DILTIAZEM CD) 300 MG 24 hr capsule Take 1 capsule (300 mg total) by mouth daily. 90 capsule 3  . lisinopril (PRINIVIL,ZESTRIL) 10 MG tablet Take 1 tablet (10 mg total) by mouth daily. 30 tablet 1  . omeprazole (PRILOSEC) 20 MG capsule Take 1 capsule (20 mg total) by mouth daily. 90 capsule 0  . rosuvastatin (CRESTOR) 40 MG tablet Take 1 tablet (40 mg total) by mouth daily. 90 tablet 3  . warfarin (COUMADIN) 3 MG tablet TAKE 1 TABLET BY MOUTH ONCE DAILY 90 tablet 0   No current facility-administered medications for this visit.    No Known Allergies     Objective:  BP 123/77   Pulse 70   Wt 183 lb (83 kg)   BMI 24.14 kg/m  Gen:  alert, not ill-appearing, no distress, appropriate for age HEENT: head normocephalic without obvious abnormality, conjunctiva and cornea clear, trachea midline Pulm: Normal work of breathing, normal phonation Neuro: alert and oriented x  3, no tremor MSK: extremities atraumatic, normal gait and station Skin: intact, no rashes on exposed skin, no jaundice, no cyanosis   Results for orders placed or performed in visit on 06/05/18 (from the past 72 hour(s))  POCT INR     Status: None   Collection Time: 06/05/18  3:38 PM  Result Value Ref Range   INR 2.9 2.0 - 3.0   No results found.    Assessment and Plan: 57 y.o. male with  .Jeremiah Delacruz was seen today for follow-up.  Diagnoses and all orders for this visit:  Paroxysmal atrial fibrillation (HCC) -     POCT INR -     apixaban (ELIQUIS) 5 MG TABS tablet; Take 1 tablet (5 mg total) by mouth 2 (two) times daily.  Hypertension goal BP (blood pressure) < 130/80  Other orders -     lisinopril (PRINIVIL,ZESTRIL) 10 MG tablet; Take 1 tablet (10 mg total) by mouth daily.   BP in range, rate controlled Cont Diltiazem and Lisinopril. Will continue to avoid beta blockers INR therapeutic He would like to start  Apixaban, but he did not contact his insurance yet. We will send prescription to start the PA process, but he was instructed not to start and to continue Coumadin for now. He was provided with co-pay card to activate. If he can afford Apixaban, we will hold Warfarin and recheck INR Friday. Once INR <2 we can start Apixaban   Patient education and anticipatory guidance given Patient agrees with treatment plan Follow-up as needed if symptoms worsen or fail to improve  Jeremiah Hubert PA-C

## 2018-06-05 NOTE — Patient Instructions (Addendum)
DO NOT START APIXABAN (ELIQUIS) Prescription was sent to see if insurance will cover. Activate your co-pay card and bring to the pharmacy. Call me when you find out if you can afford this medication. Then I will give you instruction on stopping your Coumadin and starting the new medication  Contact [facility] at [phone]

## 2018-06-14 ENCOUNTER — Encounter: Payer: Self-pay | Admitting: Vascular Surgery

## 2018-06-17 ENCOUNTER — Telehealth: Payer: Self-pay | Admitting: *Deleted

## 2018-06-17 DIAGNOSIS — I723 Aneurysm of iliac artery: Secondary | ICD-10-CM

## 2018-06-17 NOTE — Telephone Encounter (Signed)
-----   Message from Lewayne Bunting, MD sent at 05/06/2018  7:17 AM EDT ----- Schedule bilateral renal ultrasound for masses; schedule vascular surgery consult for iliac aneurysm (would discuss with pt whether he would like to be seen by vascular surgery at [facility] vs here); fu pulmonary as scheduled for lung mass; fu CTA one year of thoracic aorta. Olga Millers

## 2018-06-17 NOTE — Telephone Encounter (Signed)
Spoke with pt, aware of the iliac aneurysm seen on CT scan. He would like to see a vascular surgeon at [facility]. Referral will be placed.

## 2018-06-18 ENCOUNTER — Encounter: Payer: Medicare Other | Admitting: Vascular Surgery

## 2018-06-19 ENCOUNTER — Ambulatory Visit: Payer: Medicare Other

## 2018-06-21 ENCOUNTER — Ambulatory Visit: Payer: Medicare Other

## 2018-06-24 ENCOUNTER — Encounter: Payer: Self-pay | Admitting: *Deleted

## 2018-06-26 ENCOUNTER — Ambulatory Visit (INDEPENDENT_AMBULATORY_CARE_PROVIDER_SITE_OTHER): Payer: Medicare Other | Admitting: Physician Assistant

## 2018-06-26 DIAGNOSIS — I480 Paroxysmal atrial fibrillation: Secondary | ICD-10-CM

## 2018-06-26 DIAGNOSIS — Z7901 Long term (current) use of anticoagulants: Secondary | ICD-10-CM | POA: Diagnosis not present

## 2018-06-26 LAB — POCT INR: INR: 1.8 — AB (ref 2.0–3.0)

## 2018-06-26 NOTE — Progress Notes (Signed)
Jeremiah Delacruz is here for a INR check.  Denies changes in medications, diet, cuts and bruising. Pt stated that he missed one dose of coumadin. He is taking 3 mg everyday.  Also pt was prescribed eliquis but the medication is too expensive.  He is wanting to know is there an alternative medication to replace that one. Notified pt that he will get a call from our office staff later today with further instructions. Routing to [place] who is covering for [employer]. -EH/RMA  INR non therapeutic but you did miss a dose. Continue at current dose. Recheck in 2 weeks. Did he have coupon card for eliquis and did he activate the card?  [name] PA-C

## 2018-06-27 ENCOUNTER — Ambulatory Visit: Payer: Medicare Other

## 2018-07-10 ENCOUNTER — Ambulatory Visit: Payer: Medicare Other

## 2018-07-11 ENCOUNTER — Ambulatory Visit: Payer: Medicare Other

## 2018-07-15 ENCOUNTER — Ambulatory Visit (INDEPENDENT_AMBULATORY_CARE_PROVIDER_SITE_OTHER): Payer: Medicare Other | Admitting: Physician Assistant

## 2018-07-15 DIAGNOSIS — Z7901 Long term (current) use of anticoagulants: Secondary | ICD-10-CM | POA: Diagnosis not present

## 2018-07-15 DIAGNOSIS — I480 Paroxysmal atrial fibrillation: Secondary | ICD-10-CM

## 2018-07-15 LAB — POCT INR: INR: 1.9 — AB (ref 2.0–3.0)

## 2018-07-15 NOTE — Progress Notes (Signed)
Pt advised. Follow up scheduled  

## 2018-07-27 ENCOUNTER — Other Ambulatory Visit: Payer: Self-pay | Admitting: Physician Assistant

## 2018-07-27 DIAGNOSIS — K219 Gastro-esophageal reflux disease without esophagitis: Secondary | ICD-10-CM

## 2018-07-29 ENCOUNTER — Ambulatory Visit (INDEPENDENT_AMBULATORY_CARE_PROVIDER_SITE_OTHER): Payer: Medicare Other | Admitting: Physician Assistant

## 2018-07-29 VITALS — BP 139/86 | HR 109 | Temp 98.4°F | Wt 182.0 lb

## 2018-07-29 DIAGNOSIS — I480 Paroxysmal atrial fibrillation: Secondary | ICD-10-CM

## 2018-07-29 DIAGNOSIS — K219 Gastro-esophageal reflux disease without esophagitis: Secondary | ICD-10-CM | POA: Diagnosis not present

## 2018-07-29 DIAGNOSIS — Z7901 Long term (current) use of anticoagulants: Secondary | ICD-10-CM | POA: Diagnosis not present

## 2018-07-29 LAB — POCT INR: INR: 2 (ref 2.0–3.0)

## 2018-07-29 MED ORDER — OMEPRAZOLE 20 MG PO CPDR: 20.0000 mg | DELAYED_RELEASE_CAPSULE | Freq: Every day | ORAL | 1 refills | Status: DC

## 2018-07-29 NOTE — Progress Notes (Signed)
Pt in today for PT/INR. Pt reports taking 3mg  daily and complains of no side effects. Todays INR was 2.0. Will forward results to provider to see if any changes needed. Pt also requesting refill for omeprazole to be sent to his pharmacy.Pharmacy verifed.

## 2018-07-30 NOTE — Progress Notes (Signed)
Spoke with patient and advised to continue with current dose of 3 mg daily and to schedule nurse visit in 2 weeks for PT/INR check.Pt expressed understanding and had no questions.

## 2018-07-31 NOTE — Progress Notes (Deleted)
HPI: Follow-up atrial fibrillation. He has had previous ablation twice in [place].  He then underwent Maze procedure in 2010 in [place].  Had recurrent atrial flutter. Previously had syncope secondary to bradycardia with combination Cardizem and metoprolol. Echocardiogram August 2019 showed normal LV function, moderate left atrial enlargement.  Carotid Dopplers August 2019 showed patent left carotid stent.  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  Renal ultrasound was ordered.  Patient also recommended for vascular surgery consult.  Lung nodule followed by pulmonary.  Follow-up CTA of thoracic aorta scheduled for 1 year.  Since last seen  Current Outpatient Medications  Medication Sig Dispense Refill  . albuterol (PROVENTIL HFA;VENTOLIN HFA) 108 (90 Base) MCG/ACT inhaler Inhale 1-2 puffs into the lungs every 4 (four) hours as needed for wheezing or shortness of breath. 2 Inhaler 1  . aspirin EC 81 MG tablet Take by mouth.    . budesonide-formoterol (SYMBICORT) 160-4.5 MCG/ACT inhaler Inhale 2 puffs into the lungs 2 (two) times daily. 1 Inhaler 1  . diltiazem (DILTIAZEM CD) 300 MG 24 hr capsule Take 1 capsule (300 mg total) by mouth daily. 90 capsule 3  . lisinopril (PRINIVIL,ZESTRIL) 10 MG tablet Take 1 tablet (10 mg total) by mouth daily. 90 tablet 1  . omeprazole (PRILOSEC) 20 MG capsule Take 1 capsule (20 mg total) by mouth daily. 90 capsule 1  . rosuvastatin (CRESTOR) 40 MG tablet Take 1 tablet (40 mg total) by mouth daily. 90 tablet 3  . warfarin (COUMADIN) 3 MG tablet TAKE 1 TABLET BY  MOUTH ONCE DAILY 90 tablet 0   No current facility-administered medications for this visit.      Past Medical History:  Diagnosis Date  . A-fib (HCC)   . Atrial flutter (HCC)   . Carotid artery disease (HCC)    Carotid artery stenting   . COPD (chronic obstructive pulmonary disease) (HCC)   . CVA (cerebral vascular accident) (HCC)   . History of blood clots   . Hyperlipidemia   . Hypertension   . Lung nodule   . Microscopic hematuria 11/22/2017  . Thoracic aortic aneurysm (HCC)   . Tobacco use disorder     Past Surgical History:  Procedure Laterality Date  . ATRIAL FIBRILLATION ABLATION    . MAZE  2010    Social History   Socioeconomic History  . Marital status: Divorced    Spouse name: Not on file  . Number of children: 3  . Years of education: Not on file  . Highest education level: Not on file  Occupational History  . Not on file  Social Needs  . Financial resource strain: Not on file  . Food insecurity:    Worry: Not on file    Inability: Not on file  . Transportation needs:    Medical: Not on file    Non-medical: Not on file  Tobacco Use  . Smoking status: Current Every Day Smoker    Packs/day: 1.00    Years: 20.00    Pack years: 20.00    Types: Cigarettes  . Smokeless tobacco: Never Used  Substance and Sexual Activity  . Alcohol use:  Yes    Comment: Occasional  . Drug use: No  . Sexual activity: Not Currently  Lifestyle  . Physical activity:    Days per week: Not on file    Minutes per session: Not on file  . Stress: Not on file  Relationships  . Social connections:    Talks on phone: Not on file    Gets together: Not on file    Attends religious service: Not on file    Active member of club or organization: Not on file    Attends meetings of clubs or organizations: Not on file    Relationship status: Not on file  . Intimate partner violence:    Fear of current or ex partner: Not on file    Emotionally abused: Not on file    Physically  abused: Not on file    Forced sexual activity: Not on file  Other Topics Concern  . Not on file  Social History Narrative  . Not on file    Family History  Problem Relation Age of Onset  . Atrial fibrillation Father   . Atrial fibrillation Sister     ROS: no fevers or chills, productive cough, hemoptysis, dysphasia, odynophagia, melena, hematochezia, dysuria, hematuria, rash, seizure activity, orthopnea, PND, pedal edema, claudication. Remaining systems are negative.  Physical Exam: Well-developed well-nourished in no acute distress.  Skin is warm and dry.  HEENT is normal.  Neck is supple.  Chest is clear to auscultation with normal expansion.  Cardiovascular exam is regular rate and rhythm.  Abdominal exam nontender or distended. No masses palpated. Extremities show no edema. neuro grossly intact  ECG- personally reviewed  A/P  1 atrial flutter-patient also has a history of atrial fibrillation and [place]2.  [place]  When he was found to be in recurrent atrial flutter he was relatively asymptomatic.  We increased his Cardizem at last office visit for better rate control.  Coumadin was continued.  We again discussed cardioversion today.  2 carotid artery disease-continue statin.  No aspirin given need for Coumadin.  Follow-up carotid Dopplers August 2028.  3 thoracic aortic aneurysm and iliac aneurysm-plan follow-up CTA August 2020. We also scheduled vascular consult.  4 exophytic renal mass-arrange renal ultrasound.  5 hypertension-blood pressure is controlled.  Continue present medications and follow.  6 tobacco abuse-patient counseled on discontinuing.  7 pulmonary mass-I have asked patient to be evaluated by pulmonary.  Olga MillersBrian Crenshaw, MD

## 2018-08-07 ENCOUNTER — Ambulatory Visit: Payer: Medicare Other | Admitting: Cardiology

## 2018-08-12 ENCOUNTER — Ambulatory Visit (INDEPENDENT_AMBULATORY_CARE_PROVIDER_SITE_OTHER): Payer: Medicare Other | Admitting: Physician Assistant

## 2018-08-12 ENCOUNTER — Telehealth: Payer: Self-pay | Admitting: Physician Assistant

## 2018-08-12 DIAGNOSIS — I480 Paroxysmal atrial fibrillation: Secondary | ICD-10-CM

## 2018-08-12 DIAGNOSIS — Z7901 Long term (current) use of anticoagulants: Secondary | ICD-10-CM

## 2018-08-12 LAB — POCT INR: INR: 2.2 (ref 2.0–3.0)

## 2018-08-12 MED ORDER — DILTIAZEM HCL ER COATED BEADS 360 MG PO CP24: 360.0000 mg | ORAL_CAPSULE | Freq: Every day | ORAL | 0 refills | Status: DC

## 2018-08-12 NOTE — Telephone Encounter (Signed)
-----   Message from Lewayne BuntingBrian S Crenshaw, MD sent at 08/12/2018 10:14 AM EST ----- Regarding: RE: Rate control Patient has an appointment with me on November 20 and was a no-show.  You can increase his Cardizem and we will try and arrange follow-up appointment with us. Olga MillersBrian Crenshaw  ----- Message ----- From: Carlis Stableummings, Charley Elizabeth, PA-C Sent: 08/12/2018  10:08 AM EST To: Lewayne BuntingBrian S Crenshaw, MD Subject: Rate control                                   Hi Dr. Jens Somrenshaw, I saw Onalee Huaavid today for his INR check and his BP was poorly controlled (142-156/108) and he was mildly tachycardic (107). According to the nurse who checked his vitals he had recently smoked several cigarettes and was drinking coffee. If you look at his flowsheet, he is isn't consistently rate controlled in our office when he comes in for INR checks. Would you like me to increase his Diltiazem or would you like to see him in the office yourself? Thanks! Vinetta Bergamoharley

## 2018-08-12 NOTE — Progress Notes (Signed)
Vitals:   08/12/18 0859 08/12/18 0906  BP: (!) 156/108 (!) 142/108  Pulse: (!) 107 (!) 106   Diastolic BP notably elevated, pulse mildly tachycardic, asymptomatic Patient's BP has been challenging to control. He is currently on Diltiazem CD 300 mg and Lisinopril 10 mg. He has had symptomatic bradycardia with syncope in the recent past, felt to be due to his antihypertensive medication (beta blockade), and was hospitalized for this 3 months ago. I will message his Cardiologist regarding hypertension management

## 2018-08-12 NOTE — Telephone Encounter (Signed)
Spoke with Dr. Jens Somrenshaw Increasing Diltiazem to 360 mg New Rx sent Patient needs to schedule f/u with Dr. Jens Somrenshaw within the next month. He No-Show'ed his last appt

## 2018-08-12 NOTE — Telephone Encounter (Signed)
Pt notified of recommendations -EH/RMA  

## 2018-08-12 NOTE — Progress Notes (Signed)
Pt advised of INR changes. He had already been advised of BP changes.

## 2018-08-23 ENCOUNTER — Other Ambulatory Visit: Payer: Self-pay | Admitting: Physician Assistant

## 2018-08-23 DIAGNOSIS — Z7901 Long term (current) use of anticoagulants: Secondary | ICD-10-CM

## 2018-08-23 DIAGNOSIS — I480 Paroxysmal atrial fibrillation: Secondary | ICD-10-CM

## 2018-09-02 ENCOUNTER — Ambulatory Visit: Payer: Medicare Other

## 2018-09-03 ENCOUNTER — Ambulatory Visit (INDEPENDENT_AMBULATORY_CARE_PROVIDER_SITE_OTHER): Payer: Medicare Other | Admitting: Physician Assistant

## 2018-09-03 DIAGNOSIS — Z7901 Long term (current) use of anticoagulants: Secondary | ICD-10-CM

## 2018-09-03 DIAGNOSIS — I480 Paroxysmal atrial fibrillation: Secondary | ICD-10-CM

## 2018-09-03 LAB — POCT INR: INR: 2 (ref 2.0–3.0)

## 2018-09-03 NOTE — Progress Notes (Signed)
Pt in today for PT/INR. Todays INR was 2.2. Pt reports taking 3 mg daily. Pt's BP in office today was 142/98 pulse 80, he states he never increased medication because pharmacy said they did not receive it. Advised pt we could resend. Pt states he cannot get through to Dr. Karlene Einsteinrinshaws office to schedule appt.

## 2018-09-19 ENCOUNTER — Other Ambulatory Visit: Payer: Self-pay

## 2018-09-19 DIAGNOSIS — I480 Paroxysmal atrial fibrillation: Secondary | ICD-10-CM

## 2018-09-19 MED ORDER — DILTIAZEM HCL ER COATED BEADS 360 MG PO CP24: 360.0000 mg | ORAL_CAPSULE | Freq: Every day | ORAL | 0 refills | Status: DC

## 2018-09-23 ENCOUNTER — Ambulatory Visit (INDEPENDENT_AMBULATORY_CARE_PROVIDER_SITE_OTHER): Payer: Medicare Other | Admitting: Physician Assistant

## 2018-09-23 VITALS — BP 129/84 | HR 108 | Temp 97.6°F | Wt 194.0 lb

## 2018-09-23 DIAGNOSIS — R809 Proteinuria, unspecified: Secondary | ICD-10-CM

## 2018-09-23 DIAGNOSIS — K219 Gastro-esophageal reflux disease without esophagitis: Secondary | ICD-10-CM

## 2018-09-23 DIAGNOSIS — D582 Other hemoglobinopathies: Secondary | ICD-10-CM

## 2018-09-23 DIAGNOSIS — Z7901 Long term (current) use of anticoagulants: Secondary | ICD-10-CM | POA: Diagnosis not present

## 2018-09-23 DIAGNOSIS — R319 Hematuria, unspecified: Secondary | ICD-10-CM

## 2018-09-23 DIAGNOSIS — I10 Essential (primary) hypertension: Secondary | ICD-10-CM

## 2018-09-23 DIAGNOSIS — I480 Paroxysmal atrial fibrillation: Secondary | ICD-10-CM

## 2018-09-23 DIAGNOSIS — E7800 Pure hypercholesterolemia, unspecified: Secondary | ICD-10-CM

## 2018-09-23 DIAGNOSIS — E876 Hypokalemia: Secondary | ICD-10-CM

## 2018-09-23 LAB — POCT INR
INR: 2.2 (ref 2.0–3.0)
INR: 2.2 (ref 2.0–3.0)

## 2018-09-23 MED ORDER — ROSUVASTATIN CALCIUM 40 MG PO TABS: 40.0000 mg | ORAL_TABLET | Freq: Every day | ORAL | 0 refills | Status: AC

## 2018-09-23 MED ORDER — OMEPRAZOLE 20 MG PO CPDR: 20.0000 mg | DELAYED_RELEASE_CAPSULE | Freq: Every day | ORAL | 0 refills | Status: AC

## 2018-09-23 MED ORDER — WARFARIN SODIUM 3 MG PO TABS: 3.0000 mg | ORAL_TABLET | Freq: Every day | ORAL | 1 refills | Status: DC

## 2018-09-23 NOTE — Progress Notes (Signed)
LMFRC

## 2018-09-23 NOTE — Progress Notes (Signed)
Patient due for labs 30 day supply of medications sent to pharmacy

## 2018-09-23 NOTE — Progress Notes (Signed)
Pt in today for PT/INR. Pt reports taking 3 mg of warfarin daily. Todays INR was 2.2 . Pt also stated he needed refills on Warfarin,crestor and prilosec sent to pharmacy. Wil forward to provider for review. Pt scheduled to return in 3 weeks.

## 2018-09-25 LAB — POCT INR: INR: 2.2 (ref 2.0–3.0)

## 2018-10-02 LAB — COMPLETE METABOLIC PANEL WITH GFR
AG Ratio: 1.5 (calc) (ref 1.0–2.5)
ALBUMIN MSPROF: 3.8 g/dL (ref 3.6–5.1)
ALKALINE PHOSPHATASE (APISO): 71 U/L (ref 40–115)
ALT: 8 U/L — ABNORMAL LOW (ref 9–46)
AST: 13 U/L (ref 10–35)
BUN: 13 mg/dL (ref 7–25)
CHLORIDE: 102 mmol/L (ref 98–110)
CO2: 30 mmol/L (ref 20–32)
Calcium: 9 mg/dL (ref 8.6–10.3)
Creat: 1.07 mg/dL (ref 0.70–1.33)
GFR, Est African American: 88 mL/min/{1.73_m2} (ref >=60)
GFR, Est Non African American: 76 mL/min/{1.73_m2} (ref >=60)
Globulin: 2.5 g/dL (calc) (ref 1.9–3.7)
Glucose, Bld: 84 mg/dL (ref 65–99)
Potassium: 3.6 mmol/L (ref 3.5–5.3)
Sodium: 140 mmol/L (ref 135–146)
Total Bilirubin: 0.8 mg/dL (ref 0.2–1.2)
Total Protein: 6.3 g/dL (ref 6.1–8.1)

## 2018-10-02 LAB — CBC
HCT: 49.5 % (ref 38.5–50.0)
HEMOGLOBIN: 16.1 g/dL (ref 13.2–17.1)
MCH: 30.4 pg (ref 27.0–33.0)
MCHC: 32.5 g/dL (ref 32.0–36.0)
MCV: 93.6 fL (ref 80.0–100.0)
MPV: 10.7 fL (ref 7.5–12.5)
Platelets: 222 10*3/uL (ref 140–400)
RBC: 5.29 10*6/uL (ref 4.20–5.80)
RDW: 13.3 % (ref 11.0–15.0)
WBC: 6.5 10*3/uL (ref 3.8–10.8)

## 2018-10-02 LAB — URINALYSIS W MICROSCOPIC + REFLEX CULTURE
BILIRUBIN URINE: NEGATIVE
Bacteria, UA: NONE SEEN /HPF
Glucose, UA: NEGATIVE
Hyaline Cast: NONE SEEN /LPF
Ketones, ur: NEGATIVE
Leukocyte Esterase: NEGATIVE
Nitrites, Initial: NEGATIVE
SQUAMOUS EPITHELIAL / LPF: NONE SEEN /HPF (ref <=5)
Specific Gravity, Urine: 1.015 (ref 1.001–1.03)
WBC, UA: NONE SEEN /HPF (ref 0–5)
pH: 7 (ref 5.0–8.0)

## 2018-10-02 LAB — LIPID PANEL W/REFLEX DIRECT LDL
Cholesterol: 88 mg/dL (ref <200)
HDL: 41 mg/dL (ref >40)
LDL Cholesterol (Calc): 33 mg/dL (calc)
Non-HDL Cholesterol (Calc): 47 mg/dL (calc) (ref <130)
Total CHOL/HDL Ratio: 2.1 (calc) (ref <5.0)
Triglycerides: 67 mg/dL (ref <150)

## 2018-10-02 LAB — NO CULTURE INDICATED

## 2018-10-04 NOTE — Addendum Note (Signed)
Addended by: Gena Fray E on: 10/04/2018 02:07 PM   Modules accepted: Orders

## 2018-10-14 ENCOUNTER — Ambulatory Visit (INDEPENDENT_AMBULATORY_CARE_PROVIDER_SITE_OTHER): Payer: Medicare Other | Admitting: Physician Assistant

## 2018-10-14 DIAGNOSIS — Z7901 Long term (current) use of anticoagulants: Secondary | ICD-10-CM | POA: Diagnosis not present

## 2018-10-14 DIAGNOSIS — I480 Paroxysmal atrial fibrillation: Secondary | ICD-10-CM

## 2018-10-14 LAB — POCT INR: INR: 1.8 — AB (ref 2.0–3.0)

## 2018-10-23 ENCOUNTER — Encounter: Payer: Self-pay | Admitting: *Deleted

## 2018-12-02 ENCOUNTER — Telehealth: Payer: Self-pay | Admitting: Physician Assistant

## 2018-12-02 DIAGNOSIS — I480 Paroxysmal atrial fibrillation: Secondary | ICD-10-CM

## 2018-12-02 DIAGNOSIS — Z7901 Long term (current) use of anticoagulants: Secondary | ICD-10-CM

## 2018-12-02 MED ORDER — DILTIAZEM HCL ER COATED BEADS 360 MG PO CP24: 360.0000 mg | ORAL_CAPSULE | Freq: Every day | ORAL | 0 refills | Status: AC

## 2018-12-02 MED ORDER — WARFARIN SODIUM 3 MG PO TABS: 3.0000 mg | ORAL_TABLET | Freq: Every day | ORAL | 0 refills | Status: AC

## 2018-12-02 MED ORDER — LISINOPRIL 10 MG PO TABS: 10.0000 mg | ORAL_TABLET | Freq: Every day | ORAL | 0 refills | Status: AC

## 2018-12-02 NOTE — Telephone Encounter (Signed)
PT left me a message asking that a nurse calls him back because he has a question about his medicine. Thanks

## 2018-12-02 NOTE — Telephone Encounter (Signed)
Pt requesting refills on 3 meds. He is overdue for appointment. [place], but wants refills sent to local [facility] on file. Advised he is due for follow up. Refill for one month supply sent. Pt aware he is to follow up before refills run out.

## 2019-04-29 IMAGING — US US CAROTID DUPLEX BILAT
1 series · 13 of 24 positions shown · non-contrast
Comparison: None.

CLINICAL DATA: Follow-up left-sided carotid stent placed several
years ago in [place]. History of hypertension, hyperlipidemia and
smoking.

EXAM:
BILATERAL CAROTID DUPLEX ULTRASOUND
TECHNIQUE: Gray scale imaging, color Doppler and duplex ultrasound were
performed of bilateral carotid and vertebral arteries in the neck.

[Series 1: us carotid duplex bilat · 0.06mm/px · 13 of 69 slices shown]
[im 1/69]
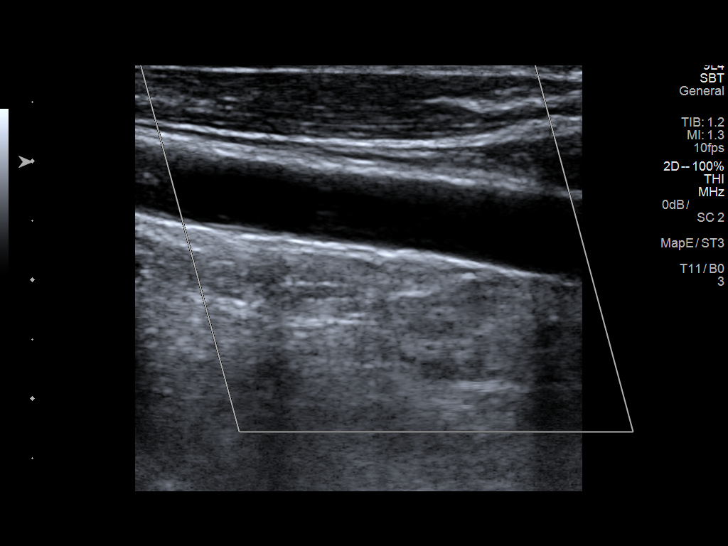
[im 6/69]
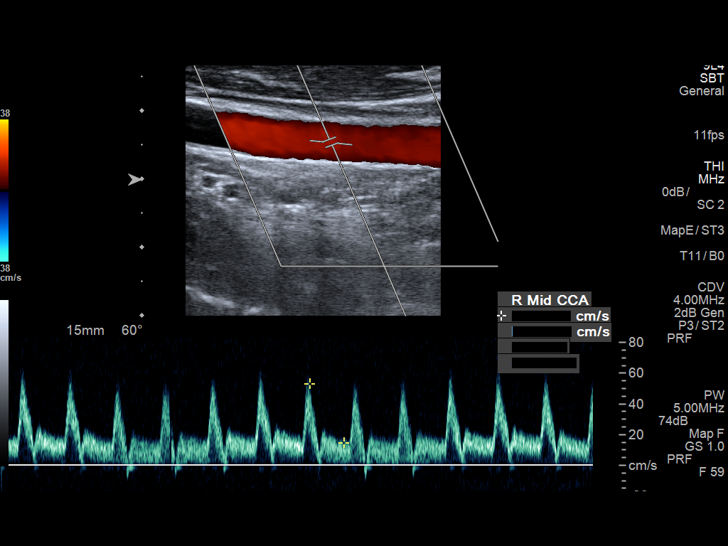
[im 12/69]
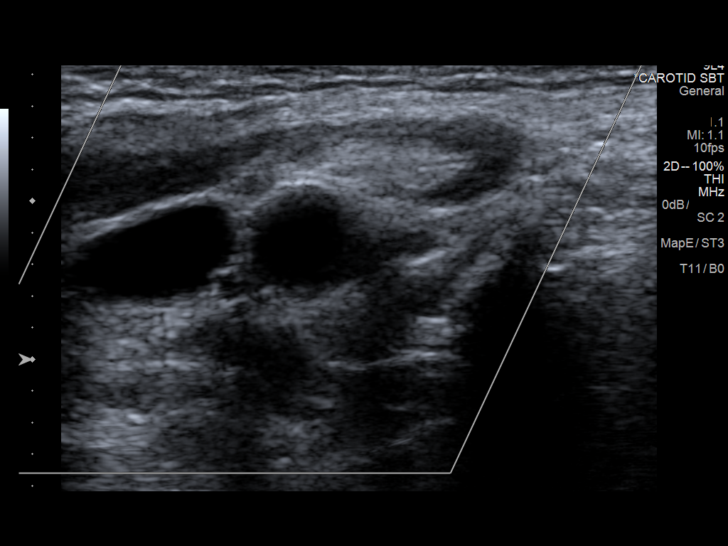
[im 18/69]
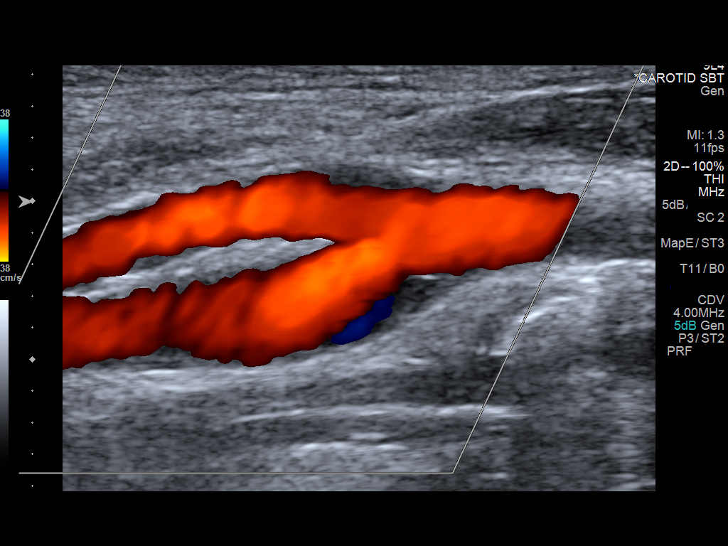
[im 24/69]
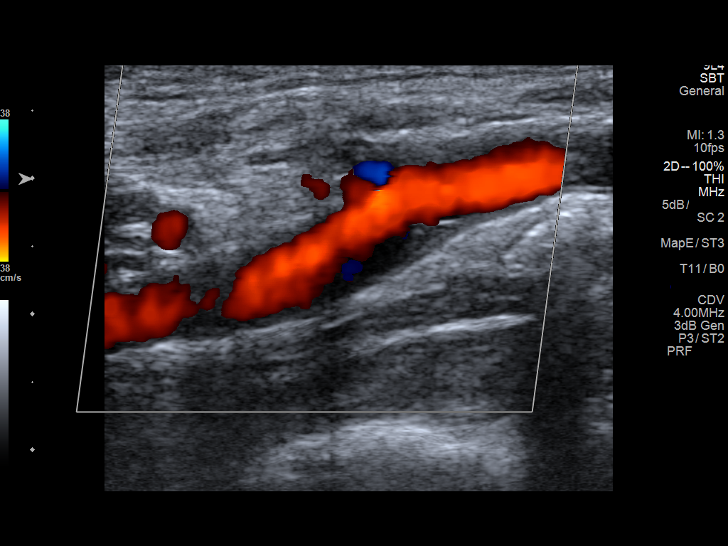
[im 30/69]
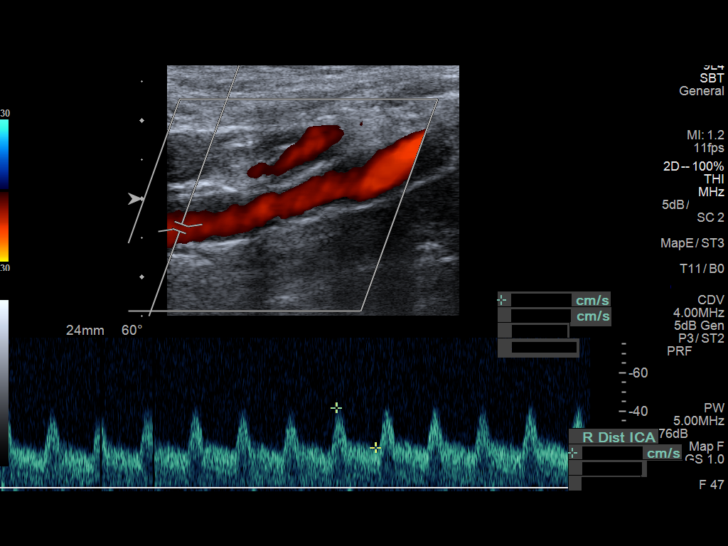
[im 36/69]
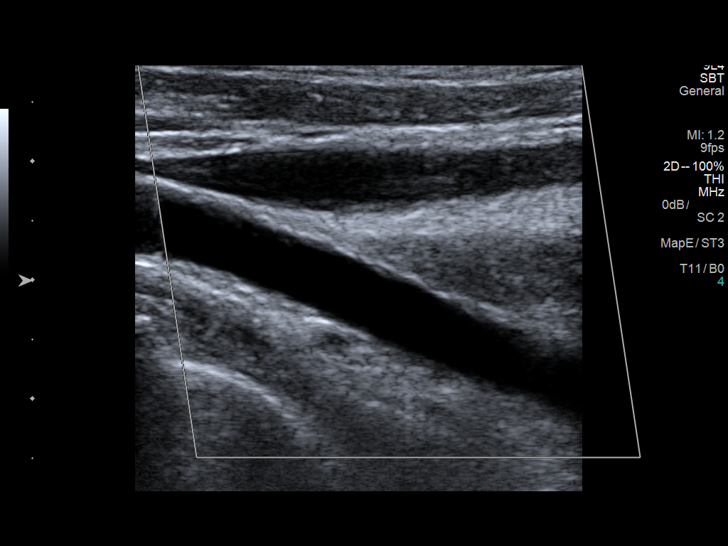
[im 39/69]
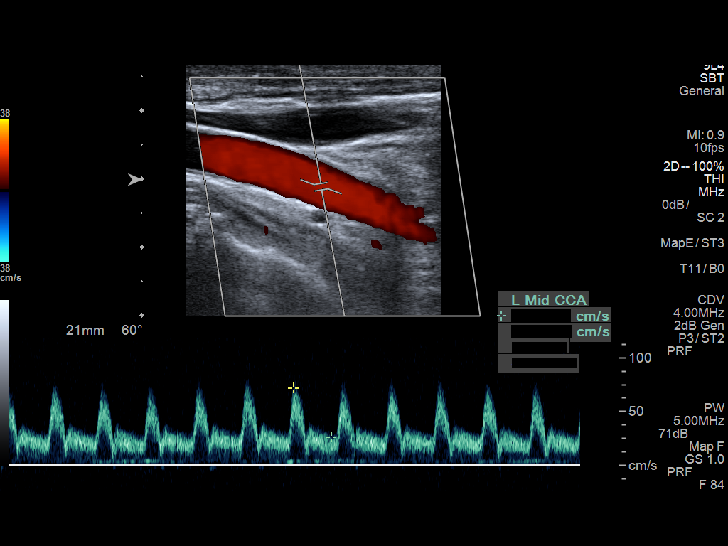
[im 45/69]
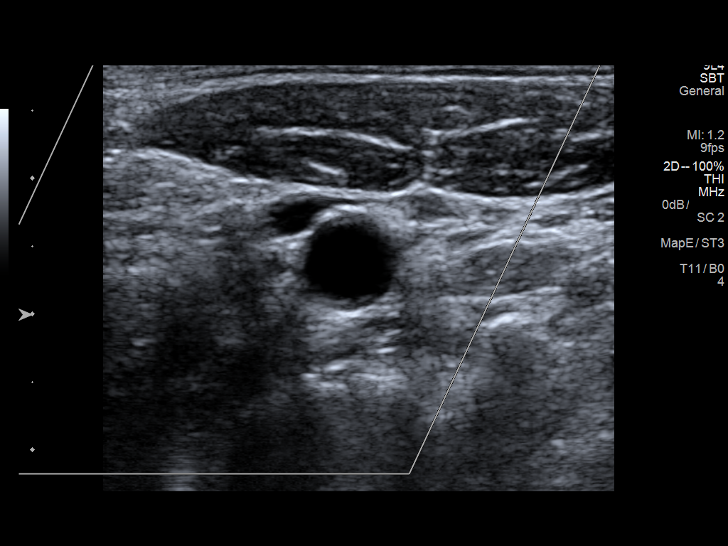
[im 51/69]
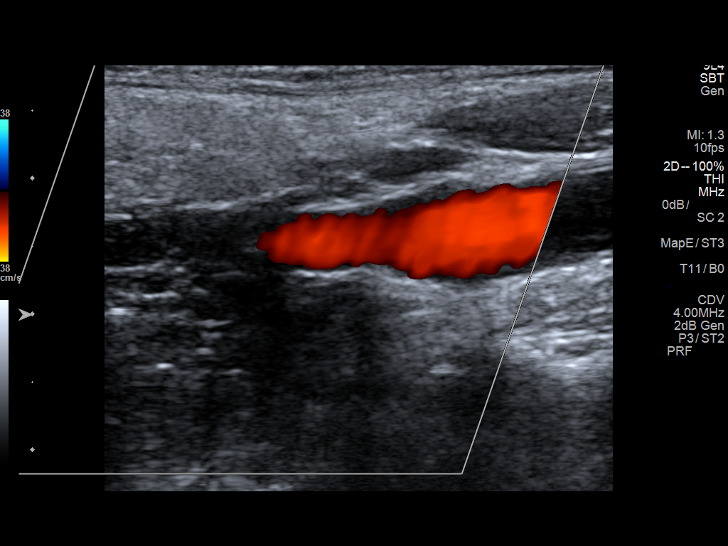
[im 57/69]
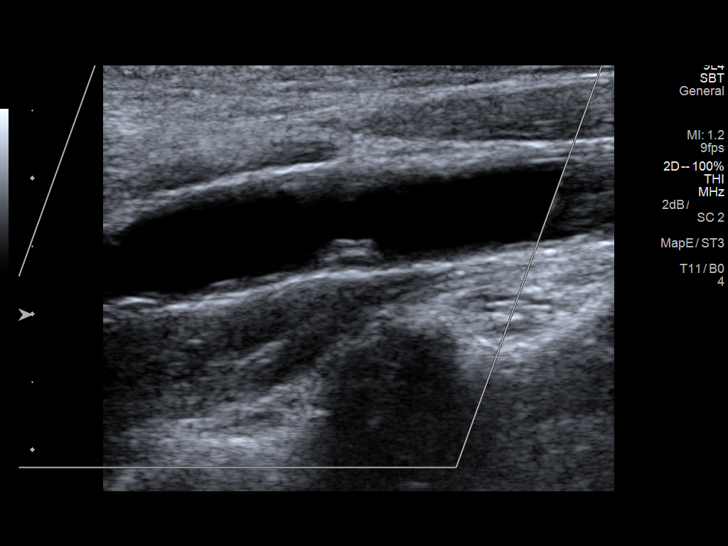
[im 63/69]
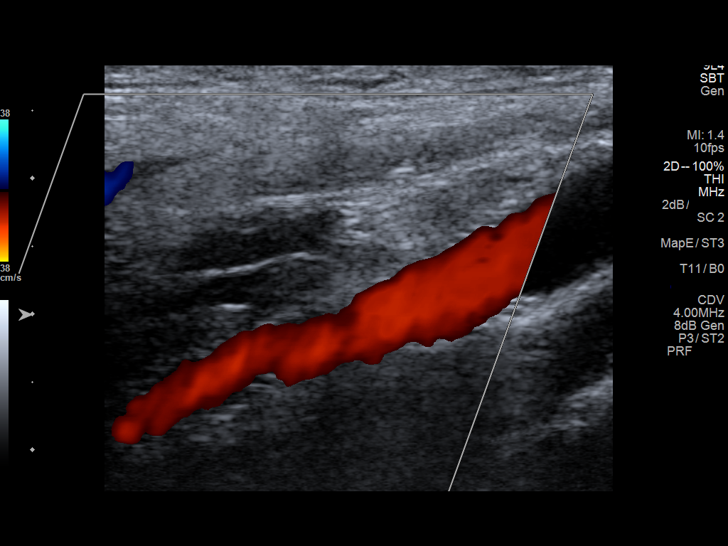
[im 69/69]
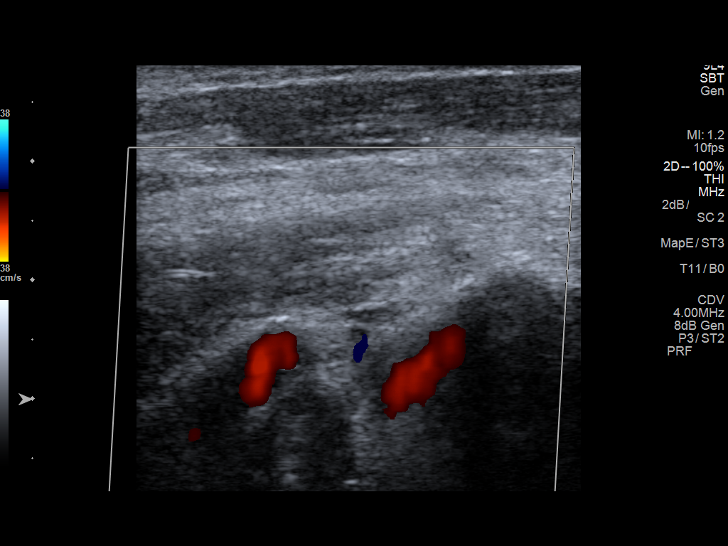

[13 of 24 positions shown; findings below may reference images not displayed]

FINDINGS: Criteria: Quantification of carotid stenosis is based on velocity
parameters that correlate the residual internal carotid diameter
with NASCET-based stenosis levels, using the diameter of the distal
internal carotid lumen as the denominator for stenosis measurement.

The following velocity measurements were obtained:

RIGHT

ICA:  68/23 cm/sec

CCA:  53/15 cm/sec

SYSTOLIC ICA/CCA RATIO:

DIASTOLIC ICA/CCA RATIO:

ECA:  65 cm/sec

LEFT

ICA:  549/29 cm/sec

CCA:  72/26 cm/sec

SYSTOLIC ICA/CCA RATIO:

DIASTOLIC ICA/CCA RATIO:

ECA:  54 cm/sec

RIGHT CAROTID ARTERY: There is a moderate amount of hypoechoic
plaque within the right carotid bulb (image 17), extending to
involve the origin and proximal aspects of the right internal
carotid artery (image 24), not resulting in elevated peak systolic
velocities within the interrogated course of the right internal
carotid artery to suggest a hemodynamically significant stenosis.

RIGHT VERTEBRAL ARTERY:  Not visualized

LEFT CAROTID ARTERY: There is an apparent stent is seen involving
the interrogated portions of the left internal carotid artery. There
is a very minimal amount of echogenic plaque involving the proximal
aspect of the left internal carotid artery (image 58), not resulting
in a hemodynamically significant stenosis.

LEFT VERTEBRAL ARTERY:  Antegrade flow
IMPRESSION: 1. Very minimal amount atherosclerotic plaque within the caudal
aspect of the left ICA stent, not resulting in a hemodynamically
significant stenosis.
2. Moderate amount of right-sided atherosclerotic plaque, not
resulting in a hemodynamically significant stenosis.
3. Non visualization of a patent right vertebral artery of uncertain
chronicity or clinical significance.
4. Antegrade flow demonstrated within left vertebral artery.

## 2019-05-12 ENCOUNTER — Ambulatory Visit (HOSPITAL_COMMUNITY)
Admission: RE | Admit: 2019-05-12 | Payer: Medicare HMO | Source: Ambulatory Visit | Attending: Cardiology | Admitting: Cardiology

## 2020-02-04 IMAGING — CT CT CTA ABD/PEL W/CM AND/OR W/O CM
2 of 8 series · 11 of 46 positions shown, 12 images · IV contrast (iopamidol)
Comparison: None.

CLINICAL DATA: Atrial fibrillation. Thoracic aortic aneurysm
without rupture.

EXAM:
CT ANGIOGRAPHY CHEST, ABDOMEN AND PELVIS
TECHNIQUE: Multidetector CT imaging through the chest, abdomen and pelvis was
performed using the standard protocol during bolus administration of
intravenous contrast. Multiplanar reconstructed images and MIPs were
obtained and reviewed to evaluate the vascular anatomy.
CONTRAST:  100mL [id number] IOPAMIDOL ([id number]) INJECTION 76%

[Series 4: axial arterial · axial · arterial · 0.78mm/px · z∈[-614,-23]mm · 8 of 245 slices shown, 9 images]
[im 24/245  soft-tissue]
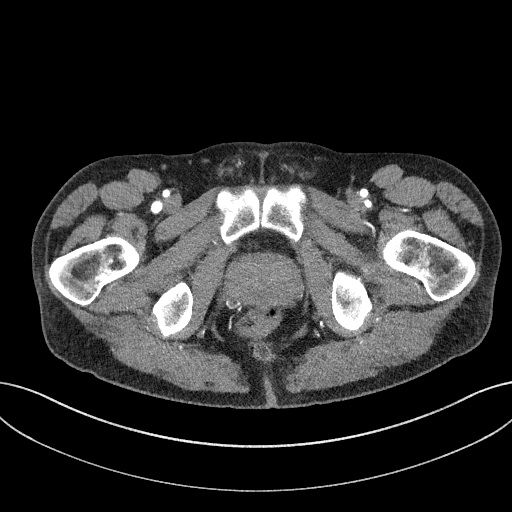
[im 24/245  bone]
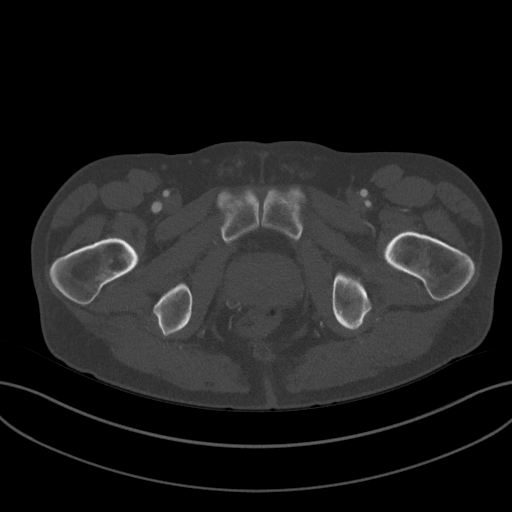
[im 47/245  soft-tissue]
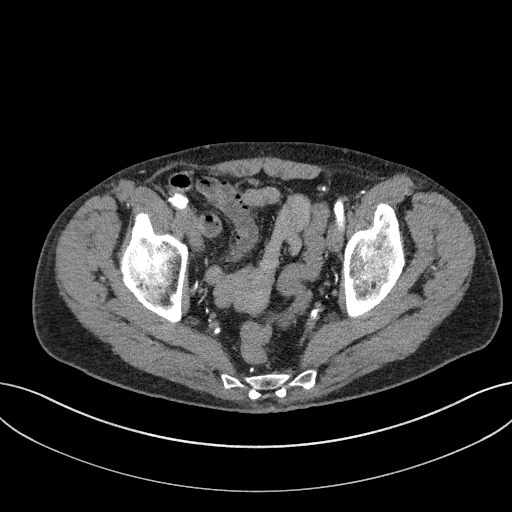
[im 82/245  soft-tissue]
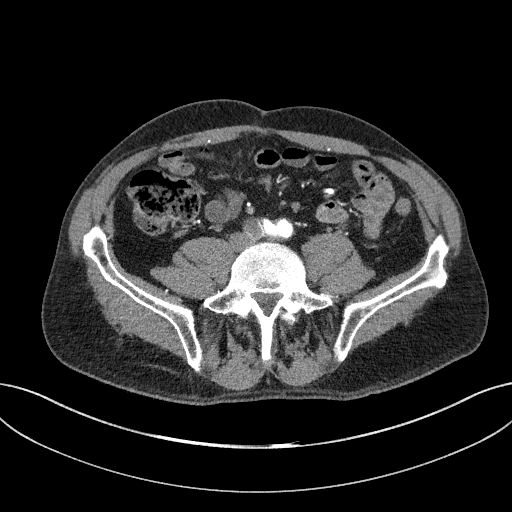
[im 105/245  soft-tissue]
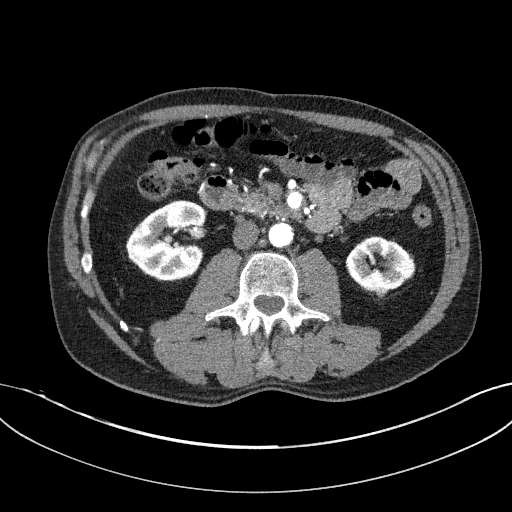
[im 140/245  soft-tissue]
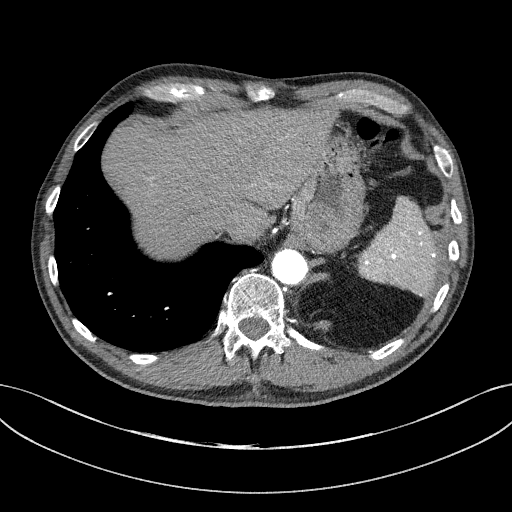
[im 163/245  soft-tissue]
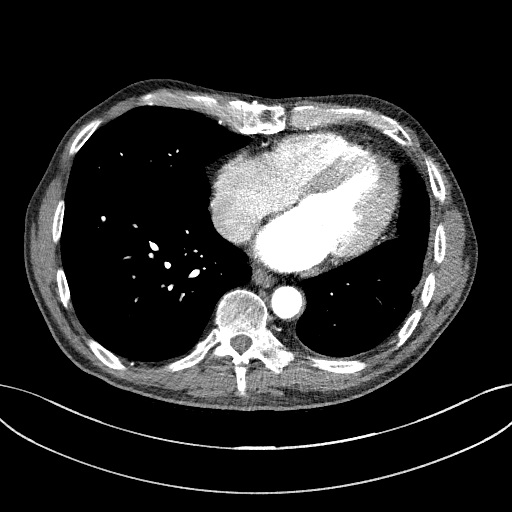
[im 198/245  soft-tissue]
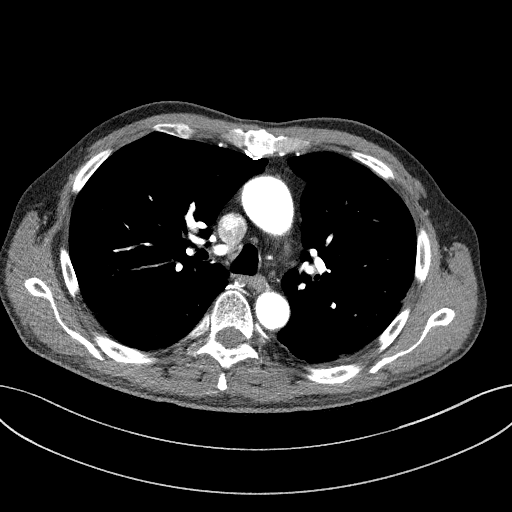
[im 221/245  soft-tissue]
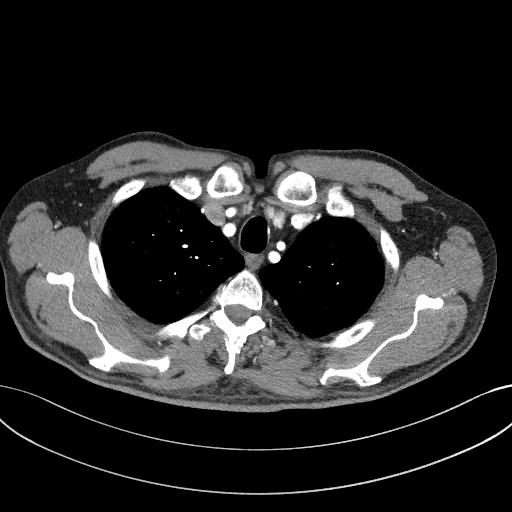

[Series 7: coronals · coronal · 0.81mm/px · 3 of 140 slices shown]
[im 35/140  soft-tissue]
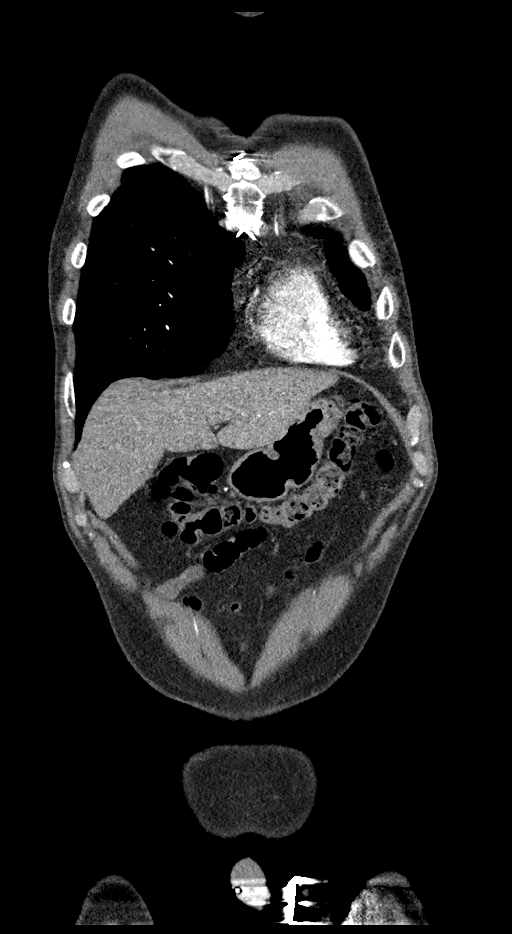
[im 70/140  soft-tissue]
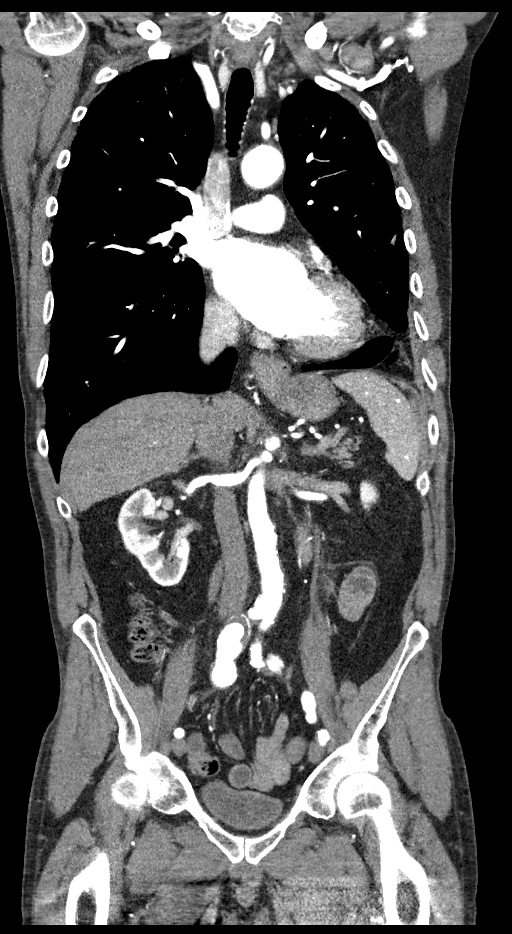
[im 105/140  soft-tissue]
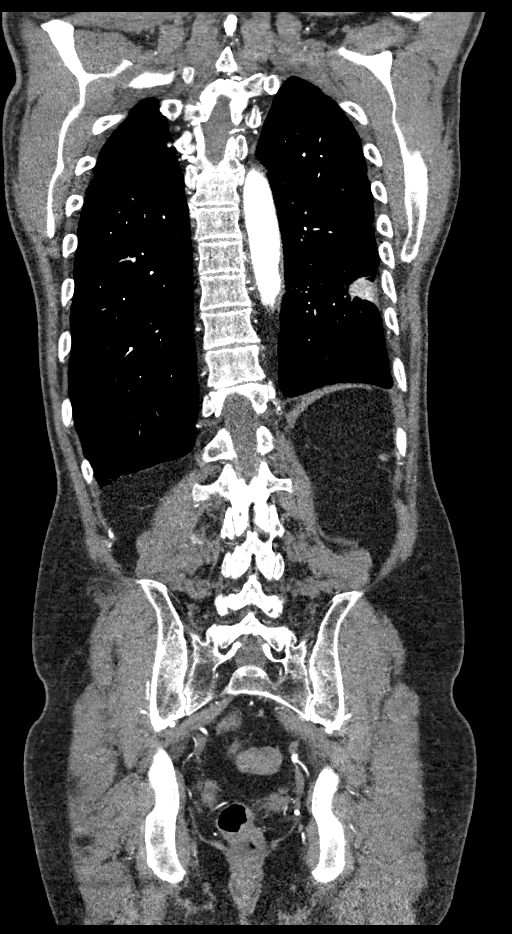

[11 of 46 positions shown; findings below may reference images not displayed]

FINDINGS: CTA CHEST FINDINGS

Cardiovascular: 4.1 cm ascending thoracic aortic aneurysm is noted
without dissection. Great vessels are widely patent without
significant stenosis. Normal cardiac size without pericardial
effusion.

Mediastinum/Nodes: No enlarged mediastinal, hilar, or axillary lymph
nodes. Thyroid gland and trachea demonstrate no significant
findings. Small sliding-type hiatal hernia is noted.

Lungs/Pleura: No pneumothorax or pleural effusion is noted. Right
lung is clear. Scarring is noted anteriorly in left upper lobe.
Large crescent shaped density is noted posteriorly in the left lower
lobe which is most consistent with scarring or rounded atelectasis
although neoplasm cannot be excluded.

Musculoskeletal: No chest wall abnormality. No acute or significant
osseous findings.

Review of the MIP images confirms the above findings.

CTA ABDOMEN AND PELVIS FINDINGS

VASCULAR

Aorta: Atherosclerosis of abdominal aorta is noted without aneurysm
or dissection.

Celiac: Moderate stenosis is noted proximally with poststenotic
dilatation measuring 14 mm in diameter.

SMA: No significant stenosis is noted proximally without evidence of
thrombus. 17 mm fusiform aneurysm is seen in distal portion of
superior mesenteric artery

Renals: Both renal arteries are patent without evidence of aneurysm,
dissection, vasculitis, fibromuscular dysplasia or significant
stenosis.

IMA: Not visualized and presumably occluded.

Inflow: Bilobed aneurysm is seen involving the distal right common
iliac artery with maximum measured diameter 3.3 cm. Severe focal
stenosis is seen at the origin of the right external iliac artery.
Status post surgical bypass graft extending from right external
iliac artery to common femoral artery which is widely patent.

2 cm left common iliac artery aneurysm is noted. Moderate stenosis
is noted at the origin of the left external iliac artery.

Veins: No obvious venous abnormality within the limitations of this
arterial phase study.

Review of the MIP images confirms the above findings.

NON-VASCULAR

Hepatobiliary: No focal liver abnormality is seen. Status post
cholecystectomy. No biliary dilatation.

Pancreas: Unremarkable. No pancreatic ductal dilatation or
surrounding inflammatory changes.

Spleen: Calcified splenic granulomata are noted. Spleen is normal in
size.

Adrenals/Urinary Tract: Adrenal glands appear normal. 2.5 cm cyst is
seen in lower pole of right kidney. 12 mm exophytic abnormality is
seen arising posteriorly from upper pole of right kidney with
Hounsfield measurement of 79. Similar but smaller abnormality seen
arising posteriorly from lower pole of left kidney measuring 9 mm.
It is uncertain if these represent hyperdense cysts or possibly
possible neoplasm. Renal ultrasound is recommended for further
evaluation.

No hydronephrosis or renal obstruction is noted. No renal or
ureteral calculi are noted. Urinary bladder is unremarkable.

Stomach/Bowel: Stomach is within normal limits. Appendix appears
normal. No evidence of bowel wall thickening, distention, or
inflammatory changes.

Lymphatic: No significant adenopathy is noted.

Reproductive: Mild prostatic enlargement is noted.

Other: No abdominal wall hernia or abnormality. No abdominopelvic
ascites.

Musculoskeletal: No acute or significant osseous findings.

Review of the MIP images confirms the above findings.
IMPRESSION: 4.1 cm ascending thoracic aortic aneurysm. Recommend annual imaging
followup by CTA or MRA. This recommendation follows 8141
ACCF/AHA/AATS/ACR/ASA/SCA/BENSAFI/KAHWAJI/POKU/CARTOLERIA Guidelines for the
Diagnosis and Management of Patients with Thoracic Aortic Disease.
Circulation. 8141; 121: [id number].

12 mm exophytic rounded abnormality arising posteriorly from lower
pole of right kidney with similar but smaller 9 mm abnormality
arising posteriorly from the left kidney. These may simply represent
hyperdense cysts, but renal ultrasound is recommended to rule out
neoplasm or mass.

Small sliding-type hiatal hernia.

Large crescent-shaped density is noted posteriorly in left lower
lobe most consistent with scarring or some degree of rounded
atelectasis. Neoplasm cannot be excluded however, and follow-up CT
scan in 6 months is recommended to ensure stability.

No evidence of abdominal aortic aneurysm or dissection.

Moderate stenosis is noted at origin of celiac artery with
poststenotic dilatation.

Superior mesenteric artery is widely patent proximally, but 17 mm
fusiform aneurysm is seen distally.

3.3 cm distal right common iliac artery aneurysm is noted. Severe
stenosis is noted at the origin of the right external iliac artery.

Status post surgical bypass of distal right external iliac artery to
common femoral artery which is widely patent.

2 cm distal left common iliac artery aneurysm is noted with moderate
stenosis noted at the origin of the left external iliac artery.

Mild prostatic enlargement.

Aortic Atherosclerosis ([id number]).

## 2024-02-17 DEATH — deceased
